# Patient Record
Sex: Male | Born: 2000 | Race: Black or African American | Hispanic: No | Marital: Single | Smoking: Never smoker
Health system: Southern US, Community
[De-identification: ages and names within clinical notes are randomized; demographics above are authoritative.]

## PROBLEM LIST (undated history)

## (undated) DIAGNOSIS — M6282 Rhabdomyolysis: Secondary | ICD-10-CM

## (undated) DIAGNOSIS — Z789 Other specified health status: Secondary | ICD-10-CM

## (undated) HISTORY — DX: Rhabdomyolysis: M62.82

## (undated) HISTORY — PX: NO PAST SURGERIES: SHX2092

---
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
## 2019-01-28 ENCOUNTER — Ambulatory Visit (INDEPENDENT_AMBULATORY_CARE_PROVIDER_SITE_OTHER)
Admission: EM | Admit: 2019-01-28 | Discharge: 2019-01-28 | Disposition: A | Discharge status: Other Acute Inpatient Hospital | Payer: Medicaid Other | Source: Home / Self Care | Attending: Emergency Medicine | Admitting: Emergency Medicine

## 2019-01-28 ENCOUNTER — Other Ambulatory Visit: Payer: Self-pay

## 2019-01-28 ENCOUNTER — Encounter: Payer: Self-pay | Admitting: Emergency Medicine

## 2019-01-28 ENCOUNTER — Inpatient Hospital Stay
Admission: EM | Admit: 2019-01-28 | Discharge: 2019-02-05 | DRG: 558 | Disposition: A | Discharge status: Home / Self Care | Payer: Medicaid Other | Attending: Family Medicine | Admitting: Family Medicine

## 2019-01-28 DIAGNOSIS — Z791 Long term (current) use of non-steroidal anti-inflammatories (NSAID): Secondary | ICD-10-CM | POA: Diagnosis not present

## 2019-01-28 DIAGNOSIS — R740 Nonspecific elevation of levels of transaminase and lactic acid dehydrogenase [LDH]: Secondary | ICD-10-CM | POA: Diagnosis present

## 2019-01-28 DIAGNOSIS — E876 Hypokalemia: Secondary | ICD-10-CM | POA: Diagnosis present

## 2019-01-28 DIAGNOSIS — M6282 Rhabdomyolysis: Principal | ICD-10-CM | POA: Diagnosis present

## 2019-01-28 DIAGNOSIS — K759 Inflammatory liver disease, unspecified: Secondary | ICD-10-CM

## 2019-01-28 DIAGNOSIS — B179 Acute viral hepatitis, unspecified: Secondary | ICD-10-CM | POA: Diagnosis not present

## 2019-01-28 HISTORY — DX: Other specified health status: Z78.9

## 2019-01-28 LAB — URINALYSIS, COMPLETE (UACMP) WITH MICROSCOPIC
BILIRUBIN URINE: NEGATIVE
Bacteria, UA: NONE SEEN
GLUCOSE, UA: NEGATIVE mg/dL
KETONES UR: NEGATIVE mg/dL
LEUKOCYTES UA: NEGATIVE
Nitrite: NEGATIVE
Specific Gravity, Urine: 1.025 (ref 1.005–1.030)
WBC UA: NONE SEEN WBC/hpf (ref 0–5)
pH: 6 (ref 5.0–8.0)

## 2019-01-28 LAB — BASIC METABOLIC PANEL
ANION GAP: 10 (ref 5–15)
BUN: 10 mg/dL (ref 6–20)
CHLORIDE: 105 mmol/L (ref 98–111)
CO2: 26 mmol/L (ref 22–32)
Calcium: 9.9 mg/dL (ref 8.9–10.3)
Creatinine, Ser: 1.09 mg/dL (ref 0.61–1.24)
GFR calc Af Amer: >60 mL/min (ref >60)
GFR calc non Af Amer: >60 mL/min (ref >60)
Glucose, Bld: 95 mg/dL (ref 70–99)
POTASSIUM: 3.5 mmol/L (ref 3.5–5.1)
SODIUM: 141 mmol/L (ref 135–145)

## 2019-01-28 LAB — CK: Total CK: 34473 U/L — ABNORMAL HIGH (ref 49–397)

## 2019-01-28 MED ORDER — SODIUM CHLORIDE 0.9 % IV BOLUS
2000.0000 mL | Freq: Once | INTRAVENOUS | Status: AC
  Administered 2019-01-28: 2000 mL via INTRAVENOUS

## 2019-01-28 NOTE — ED Triage Notes (Signed)
Pt here for bilateral arm pain for 2 days. Is in weightlifting class and said right now the pain is worse in his right arm. No injuries reported. Does report swelling in his right arm. Redness in his right lower arm but does see obvious protrusions in his right muscle.

## 2019-01-28 NOTE — ED Provider Notes (Signed)
HPI  SUBJECTIVE:  Jason Glass is a 18 y.o. male who presents with constant stabbing bilateral arm pain, swelling, inability to fully extend his arms yesterday.  Reports some pectoral muscle pain.  States that he has been weightlifting daily for the past 2-1/2 weeks with the advanced lifting class/football players.  Said he is rotating muscle groups, but that he had a heavy arm workout yesterday.  He denies pushing himself or lifting heavier weight than usual.  Mother states that he has not lifted weights since last spring. States that the arm swelling is located along the lateral ACs bilaterally.  He tried Tylenol, ice, hot bath.  The hot baths seem to help.  Symptoms are worse when he attempts to extend his arm.  He denies numbness or tingling his hand, color changes, color in the change of his urine.  No nausea, vomiting, fevers, lower extremity edema, leg pain.  Last dose of Tylenol was within 4 to 6 hours of evaluation.  Past medical history negative for kidney disease, rhabdomyolysis.  All immunizations are up-to-date.  Patient is adopted.  PMD: They are planning to establish care at [facility] primary care.    History reviewed. No pertinent past medical history.  History reviewed. No pertinent surgical history.  Family History  Problem Relation Age of Onset  . Healthy Mother   . Healthy Father     Social History   Tobacco Use  . Smoking status: Never Smoker  . Smokeless tobacco: Never Used  Substance Use Topics  . Alcohol use: No  . Drug use: Not on file    No current facility-administered medications for this encounter.   Current Outpatient Medications:  .  ibuprofen (ADVIL,MOTRIN) 400 MG tablet, Take 1 tablet (400 mg total) by mouth 3 (three) times daily., Disp: 30 tablet, Rfl: 1  No Known Allergies   ROS  As noted in HPI.   Physical Exam  BP (!) 143/70 (BP Location: Left Arm)   Pulse 63   Temp 98.6 F (37 C) (Oral)   Resp 18   Ht 5\' 8"  (1.727 m)   Wt 73.9 kg   SpO2  100%   BMI 24.78 kg/m   Constitutional: Well developed, well nourished, no acute distress Eyes:  EOMI, conjunctiva normal bilaterally HENT: Normocephalic, atraumatic,mucus membranes moist Respiratory: Normal inspiratory effort Cardiovascular: Normal rate GI: nondistended skin: No rash, skin intact Musculoskeletal: Arms symmetric.  Positive exquisite tenderness over bilateral biceps.  Some swelling over the lateral ACs bilaterally.  No tenderness of the deltoid, triceps.  RP 2+ bilaterally.  No tenderness over the forearm.  Patient unable to completely straighten his arms bilaterally.  No color changes.  Positive mild tenderness over bilateral pectoral muscles. Neurologic: Alert & oriented x 3, no focal neuro deficits Psychiatric: Speech and behavior appropriate   ED Course   Medications - No data to display  Orders Placed This Encounter  Procedures  . Basic metabolic panel    Standing Status:   Standing    Number of Occurrences:   1  . Urinalysis, Complete w Microscopic    Standing Status:   Standing    Number of Occurrences:   1  . CK    Standing Status:   Standing    Number of Occurrences:   1    Results for orders placed or performed during the hospital encounter of 01/28/19 (from the past 24 hour(s))  Basic metabolic panel     Status: None   Collection Time: 01/28/19  8:08  PM  Result Value Ref Range   Sodium 141 135 - 145 mmol/L   Potassium 3.5 3.5 - 5.1 mmol/L   Chloride 105 98 - 111 mmol/L   CO2 26 22 - 32 mmol/L   Glucose, Bld 95 70 - 99 mg/dL   BUN 10 6 - 20 mg/dL   Creatinine, Ser 1.611.09 0.61 - 1.24 mg/dL   Calcium 9.9 8.9 - 09.610.3 mg/dL   GFR calc non Af Amer >60 >60 mL/min   GFR calc Af Amer >60 >60 mL/min   Anion gap 10 5 - 15  Urinalysis, Complete w Microscopic     Status: Abnormal   Collection Time: 01/28/19  8:08 PM  Result Value Ref Range   Color, Urine YELLOW YELLOW   APPearance HAZY (A) CLEAR   Specific Gravity, Urine 1.025 1.005 - 1.030   pH 6.0  5.0 - 8.0   Glucose, UA NEGATIVE NEGATIVE mg/dL   Hgb urine dipstick MODERATE (A) NEGATIVE   Bilirubin Urine NEGATIVE NEGATIVE   Ketones, ur NEGATIVE NEGATIVE mg/dL   Protein, ur TRACE (A) NEGATIVE mg/dL   Nitrite NEGATIVE NEGATIVE   Leukocytes, UA NEGATIVE NEGATIVE   Squamous Epithelial / LPF 0-5 0 - 5   WBC, UA NONE SEEN 0 - 5 WBC/hpf   RBC / HPF 6-10 0 - 5 RBC/hpf   Bacteria, UA NONE SEEN NONE SEEN   Mucus PRESENT   CK     Status: Abnormal   Collection Time: 01/28/19  8:08 PM  Result Value Ref Range   Total CK 34,473 (H) 49 - 397 U/L   No results found.  ED Clinical Impression  Non-traumatic rhabdomyolysis   ED Assessment/Plan  Concern for rhabdomyolysis.  Checking UA, BMP, CK.  Will reevaluate.  Has moderate hematuria but only few RBCs.  His kidney function is normal.  CK is being diluted down.  Final result pending.  Final CK 34,473.  S[facility] ED for rhabdomyolysis.  Feel that he is stable to go by private vehicle.   Discussed labs, MDM, treatment plan, and rationale for transfer with patient and parent.  They agree with plan.  Discussed with triage nurse.  No orders of the defined types were placed in this encounter.   *This clinic note was created using Dragon dictation software. Therefore, there may be occasional mistakes despite careful proofreading.   ?    Domenick GongMortenson, Quantasia Stegner, MD 01/28/19 2150

## 2019-01-28 NOTE — ED Triage Notes (Signed)
PT was seen at urgent care and diagnosed with rhabdomyolysis.

## 2019-01-29 ENCOUNTER — Other Ambulatory Visit: Payer: Self-pay

## 2019-01-29 ENCOUNTER — Encounter: Payer: Self-pay | Admitting: *Deleted

## 2019-01-29 LAB — CK
CK TOTAL: 48252 U/L — AB (ref 49–397)
Total CK: 35402 U/L — ABNORMAL HIGH (ref 49–397)
Total CK: 47166 U/L — ABNORMAL HIGH (ref 49–397)

## 2019-01-29 LAB — BASIC METABOLIC PANEL
Anion gap: 4 — ABNORMAL LOW (ref 5–15)
BUN: 9 mg/dL (ref 6–20)
CO2: 26 mmol/L (ref 22–32)
Calcium: 8.9 mg/dL (ref 8.9–10.3)
Chloride: 109 mmol/L (ref 98–111)
Creatinine, Ser: 1.04 mg/dL (ref 0.61–1.24)
GFR calc Af Amer: >60 mL/min (ref >60)
GFR calc non Af Amer: >60 mL/min (ref >60)
Glucose, Bld: 93 mg/dL (ref 70–99)
Potassium: 3.6 mmol/L (ref 3.5–5.1)
Sodium: 139 mmol/L (ref 135–145)

## 2019-01-29 MED ORDER — ZOLPIDEM TARTRATE 5 MG PO TABS: 5.0000 mg | ORAL_TABLET | Freq: Every evening | ORAL | Status: DC | PRN

## 2019-01-29 MED ORDER — SODIUM BICARBONATE 8.4 % IV SOLN
INTRAVENOUS | Status: DC
  Administered 2019-01-29 – 2019-01-30 (×8): via INTRAVENOUS
  Filled 2019-01-29 (×17): qty 150

## 2019-01-29 MED ORDER — INFLUENZA VAC SPLIT QUAD 0.5 ML IM SUSY
0.5000 mL | PREFILLED_SYRINGE | INTRAMUSCULAR | Status: DC
  Filled 2019-01-29: qty 0.5

## 2019-01-29 MED ORDER — ACETAMINOPHEN 325 MG PO TABS
650.0000 mg | ORAL_TABLET | Freq: Four times a day (QID) | ORAL | Status: DC | PRN
  Administered 2019-01-29 (×2): 650 mg via ORAL
  Filled 2019-01-29 (×2): qty 2

## 2019-01-29 MED ORDER — ONDANSETRON HCL 4 MG/2ML IJ SOLN: 4.0000 mg | Freq: Four times a day (QID) | INTRAMUSCULAR | Status: DC | PRN

## 2019-01-29 MED ORDER — SODIUM CHLORIDE 0.9 % IV SOLN
INTRAVENOUS | Status: DC
  Administered 2019-01-29: 02:00:00 via INTRAVENOUS

## 2019-01-29 MED ORDER — ZOLPIDEM TARTRATE 5 MG PO TABS
5.0000 mg | ORAL_TABLET | Freq: Once | ORAL | Status: AC
  Administered 2019-01-29: 5 mg via ORAL
  Filled 2019-01-29: qty 1

## 2019-01-29 MED ORDER — CYCLOBENZAPRINE HCL 10 MG PO TABS: 5.0000 mg | ORAL_TABLET | Freq: Three times a day (TID) | ORAL | Status: DC | PRN

## 2019-01-29 MED ORDER — ZOLPIDEM TARTRATE 5 MG PO TABS
5.0000 mg | ORAL_TABLET | Freq: Every evening | ORAL | Status: DC | PRN
  Administered 2019-01-29 – 2019-01-31 (×2): 5 mg via ORAL
  Filled 2019-01-29 (×2): qty 1

## 2019-01-29 NOTE — ED Notes (Signed)
Pt provided turkey sandwich tray per request. 

## 2019-01-29 NOTE — ED Notes (Signed)
Hospitalist to bedside at this time 

## 2019-01-29 NOTE — H&P (Addendum)
[facility] [place] [facility]   PATIENT NAME: Jason Glass    MR#:  [id number]  DATE OF BIRTH:  September 13, 2001  DATE OF ADMISSION:  01/28/2019  PRIMARY CARE PHYSICIAN: Patient, No Pcp Per   REQUESTING/REFERRING PHYSICIAN: Manson PasseyBrown  CHIEF COMPLAINT:   Chief Complaint  Patient presents with  . Abnormal Lab    HISTORY OF PRESENT ILLNESS: Jason Oldslijah Mascio  is a 18 y.o. male with a known history of no medical issues, recently enrolled in weight lifting team in school and started doing high intensity exercises.  For last few days he has been hurting excruciating in his muscles so came to emergency room.  Noted to have very high CK level and rhabdomyolysis.  Renal function is normal.  Patient denies any other complaints.  PAST MEDICAL HISTORY:   Past Medical History:  Diagnosis Date  . Medical history non-contributory     PAST SURGICAL HISTORY:  Past Surgical History:  Procedure Laterality Date  . NO PAST SURGERIES      SOCIAL HISTORY:  Social History   Tobacco Use  . Smoking status: Never Smoker  . Smokeless tobacco: Never Used  Substance Use Topics  . Alcohol use: No    FAMILY HISTORY:  Family History  Problem Relation Age of Onset  . Healthy Mother   . Healthy Father     DRUG ALLERGIES: No Known Allergies  REVIEW OF SYSTEMS:   CONSTITUTIONAL: No fever, fatigue or weakness.  EYES: No blurred or double vision.  EARS, NOSE, AND THROAT: No tinnitus or ear pain.  RESPIRATORY: No cough, shortness of breath, wheezing or hemoptysis.  CARDIOVASCULAR: No chest pain, orthopnea, edema.  GASTROINTESTINAL: No nausea, vomiting, diarrhea or abdominal pain.  GENITOURINARY: No dysuria, hematuria.  ENDOCRINE: No polyuria, nocturia,  HEMATOLOGY: No anemia, easy bruising or bleeding SKIN: No rash or lesion. MUSCULOSKELETAL: No joint pain or arthritis.   NEUROLOGIC: No tingling, numbness, weakness.  PSYCHIATRY: No anxiety or depression.   MEDICATIONS AT HOME:   Prior to Admission medications   Medication Sig Start Date End Date Taking? Authorizing Provider  ibuprofen (ADVIL,MOTRIN) 400 MG tablet Take 1 tablet (400 mg total) by mouth 3 (three) times daily. 03/06/17   Menshew, Charlesetta IvoryJenise V Bacon, PA-C      PHYSICAL EXAMINATION:   VITAL SIGNS: Blood pressure 126/70, pulse (!) 48, temperature 98.2 F (36.8 C), temperature source Oral, resp. rate 16, height 5\' 8"  (1.727 m), weight 73 kg, SpO2 99 %.  GENERAL:  18 y.o.-year-old patient lying in the bed with no acute distress.  EYES: Pupils equal, round, reactive to light and accommodation. No scleral icterus. Extraocular muscles intact.  HEENT: Head atraumatic, normocephalic. Oropharynx and nasopharynx clear.  NECK:  Supple, no jugular venous distention. No thyroid enlargement, no tenderness.  LUNGS: Normal breath sounds bilaterally, no wheezing, rales,rhonchi or crepitation. No use of accessory muscles of respiration.  CARDIOVASCULAR: S1, S2 normal. No murmurs, rubs, or gallops.  ABDOMEN: Soft, nontender, nondistended. Bowel sounds present. No organomegaly or mass.  EXTREMITIES: No pedal edema, cyanosis, or clubbing.  Muscles tender on local pressure. NEUROLOGIC: Cranial nerves II through XII are intact. Muscle strength 5/5 in all extremities. Sensation intact. Gait not checked.  PSYCHIATRIC: The patient is alert and oriented x 3.  SKIN: No obvious rash, lesion, or ulcer.   LABORATORY PANEL:   CBC No results for input(s): WBC, HGB, HCT, PLT, MCV, MCH, MCHC, RDW, LYMPHSABS, MONOABS, EOSABS, BASOSABS, BANDABS in the last 168 hours.  Invalid input(s): NEUTRABS,  BANDSABD ------------------------------------------------------------------------------------------------------------------  Chemistries  Recent Labs  Lab 01/28/19 2008  NA 141  K 3.5  CL 105  CO2 26  GLUCOSE 95  BUN 10  CREATININE 1.09  CALCIUM 9.9    ------------------------------------------------------------------------------------------------------------------ estimated creatinine clearance is 106.3 mL/min (by C-G formula based on SCr of 1.09 mg/dL). ------------------------------------------------------------------------------------------------------------------ No results for input(s): TSH, T4TOTAL, T3FREE, THYROIDAB in the last 72 hours.  Invalid input(s): FREET3   Coagulation profile No results for input(s): INR, PROTIME in the last 168 hours. ------------------------------------------------------------------------------------------------------------------- No results for input(s): DDIMER in the last 72 hours. -------------------------------------------------------------------------------------------------------------------  Cardiac Enzymes No results for input(s): CKMB, TROPONINI, MYOGLOBIN in the last 168 hours.  Invalid input(s): CK ------------------------------------------------------------------------------------------------------------------ Invalid input(s): POCBNP  ---------------------------------------------------------------------------------------------------------------  Urinalysis    Component Value Date/Time   COLORURINE YELLOW 01/28/2019 2008   APPEARANCEUR HAZY (A) 01/28/2019 2008   LABSPEC 1.025 01/28/2019 2008   PHURINE 6.0 01/28/2019 2008   GLUCOSEU NEGATIVE 01/28/2019 2008   HGBUR MODERATE (A) 01/28/2019 2008   BILIRUBINUR NEGATIVE 01/28/2019 2008   KETONESUR NEGATIVE 01/28/2019 2008   PROTEINUR TRACE (A) 01/28/2019 2008   NITRITE NEGATIVE 01/28/2019 2008   LEUKOCYTESUR NEGATIVE 01/28/2019 2008     RADIOLOGY: No results found.  EKG: No orders found for this or any previous visit.  IMPRESSION AND PLAN:  *Rhabdomyolysis IV fluid. Encourage to drink 3 to 4 L of water every day for next few days. Follow CK level and renal function daily. Advised to start slow with basic exercise and  slowly build up over few days on intensity.    All the records are reviewed and case discussed with ED provider. Management plans discussed with the patient, family and they are in agreement.  CODE STATUS: Full code  Patient's adoption mother was present in the room during my visit.  TOTAL TIME TAKING CARE OF THIS PATIENT: 40 minutes.    Altamese Dilling M.D on 01/29/2019   Between 7am to 6pm - Pager - [phone]  After 6pm go to [email or web address] - password [email or web address]  [facility] [place] [facility]  Office  [phone]  CC: Primary care physician; Patient, No Pcp Per   Note: This dictation was prepared with Dragon dictation along with smaller phrase technology. Any transcriptional errors that result from this process are unintentional.

## 2019-01-29 NOTE — Progress Notes (Signed)
[facility] - [place] [facility]   PATIENT NAME: Jason Glass    MR#:  [id number]  DATE OF BIRTH:  04-25-2001  SUBJECTIVE:  CHIEF COMPLAINT:   Chief Complaint  Patient presents with  . Abnormal Lab  Patient seen and evaluated today Has some body aches Soreness in the calf muscles  REVIEW OF SYSTEMS:    ROS  CONSTITUTIONAL: No documented fever. Has fatigue, weakness. No weight gain, no weight loss.  EYES: No blurry or double vision.  ENT: No tinnitus. No postnasal drip. No redness of the oropharynx.  RESPIRATORY: No cough, no wheeze, no hemoptysis. No dyspnea.  CARDIOVASCULAR: No chest pain. No orthopnea. No palpitations. No syncope.  GASTROINTESTINAL: No nausea, no vomiting or diarrhea. No abdominal pain. No melena or hematochezia.  GENITOURINARY: No dysuria or hematuria.  ENDOCRINE: No polyuria or nocturia. No heat or cold intolerance.  HEMATOLOGY: No anemia. No bruising. No bleeding.  INTEGUMENTARY: No rashes. No lesions.  MUSCULOSKELETAL: No arthritis. No swelling. No gout.  Muscle aches noted NEUROLOGIC: No numbness, tingling, or ataxia. No seizure-type activity.  PSYCHIATRIC: No anxiety. No insomnia. No ADD.   DRUG ALLERGIES:  No Known Allergies  VITALS:  Blood pressure 123/80, pulse (!) 44, temperature 98.3 F (36.8 C), temperature source Oral, resp. rate 14, height 5\' 8"  (1.727 m), weight 73 kg, SpO2 99 %.  PHYSICAL EXAMINATION:   Physical Exam  GENERAL:  18 y.o.-year-old patient lying in the bed with no acute distress.  EYES: Pupils equal, round, reactive to light and accommodation. No scleral icterus. Extraocular muscles intact.  HEENT: Head atraumatic, normocephalic. Oropharynx and nasopharynx clear.  NECK:  Supple, no jugular venous distention. No thyroid enlargement, no tenderness.  LUNGS: Normal breath sounds bilaterally, no wheezing, rales, rhonchi. No use of accessory muscles of respiration.  CARDIOVASCULAR: S1, S2 normal. No  murmurs, rubs, or gallops.  ABDOMEN: Soft, nontender, nondistended. Bowel sounds present. No organomegaly or mass.  EXTREMITIES: No cyanosis, clubbing or edema b/l.    NEUROLOGIC: Cranial nerves II through XII are intact. No focal Motor or sensory deficits b/l.   PSYCHIATRIC: The patient is alert and oriented x 3.  SKIN: No obvious rash, lesion, or ulcer.   LABORATORY PANEL:   CBC No results for input(s): WBC, HGB, HCT, PLT in the last 168 hours. ------------------------------------------------------------------------------------------------------------------ Chemistries  Recent Labs  Lab 01/29/19 0331  NA 139  K 3.6  CL 109  CO2 26  GLUCOSE 93  BUN 9  CREATININE 1.04  CALCIUM 8.9   ------------------------------------------------------------------------------------------------------------------  Cardiac Enzymes No results for input(s): TROPONINI in the last 168 hours. ------------------------------------------------------------------------------------------------------------------  RADIOLOGY:  No results found.   ASSESSMENT AND PLAN:   18 year old male patient with no significant past medical history currently under hospitalist service  -Acute rhabdomyolysis Change IV fluids to IV bicarbonate with dextrose at 200 mL/h Follow-up CK level every 4 hourly Monitor renal function  -Generalized body aches secondary to rhabdomyolysis Symptomatic management  -DVT prophylaxis sequential compression device to lower extremities  All the records are reviewed and case discussed with Care Management/Social Worker. Management plans discussed with the patient, family and they are in agreement.  CODE STATUS:  Full code  DVT Prophylaxis: SCDs  TOTAL TIME TAKING CARE OF THIS PATIENT: 36 minutes.   POSSIBLE D/C IN 2 to 3 DAYS, DEPENDING ON CLINICAL CONDITION.  Ihor AustinPavan Pyreddy M.D on 01/29/2019 at 11:25 AM  Between 7am to 6pm - Pager - [phone]  After 6pm go to  [email or web address] - password  [email or web address]  [facility] [place] [facility]  Office  [phone]  CC: Primary care physician; Patient, No Pcp Per  Note: This dictation was prepared with Dragon dictation along with smaller phrase technology. Any transcriptional errors that result from this process are unintentional.

## 2019-01-29 NOTE — Progress Notes (Signed)
Nutrition Brief Note  Patient identified on the Malnutrition Screening Tool (MST) Report  Wt Readings from Last 15 Encounters:  01/29/19 73 kg (68 %, Z= 0.47)*  01/28/19 73.9 kg (71 %, Z= 0.54)*  03/06/17 63.5 kg (56 %, Z= 0.16)*   * Growth percentiles are based on CDC (Boys, 2-20 Years) data.   Lela Gochenaur  is a 18 y.o. male with a known history of no medical issues, recently enrolled in weight lifting team in school and started doing high intensity exercises.  For last few days he has been hurting excruciating in his muscles so came to emergency room.  Noted to have very high CK level and rhabdomyolysis.  Renal function is normal.  Patient denies any other complaints.  Pt admitted with rhabdomyolosis.   Reviewed I/O's: +2.5 L x 24 hours  Spoke with pt and school coach at bedside. Pt was very pleasant and reports feeling well today. 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 He typically has a good appetite; he skips breakfast, but snacks during class and consumes lunch and dinner (Snack: cheese, crackers, bottle of water; Lunch: school lunch; Dinner: meat, starch, and vegetable). Pt reports he did not eat dinner two days this week due to lack of appetite.   Pt reports his UBW is 163# and estimates he has lost 3# within the past week. Observed meal tray- pt consumed 100% of eggs, pancakes, milk, and fruit cup.   Nutrition-Focused physical exam completed. Findings are no fat depletion, no muscle depletion, and no edema.   Discussed importance of good meal completion to promote healing. Pt with no further questions or concerns, but expressed appreciation for visit.   Labs reviewed.  Body mass index is 24.46 kg/m. Patient meets criteria for normal weight range based on current BMI.   Current diet order is regular, patient is consuming approximately 75% of meals at this time. Labs and  medications reviewed.   No nutrition interventions warranted at this time. If nutrition issues arise, please consult RD.   Shelvie Salsberry A. Mayford Knife, RD, LDN, CDE Pager: [phone] After hours Pager: [phone]

## 2019-01-29 NOTE — ED Provider Notes (Signed)
[facility] Emergency Department Provider Note   First MD Initiated Contact with Patient 01/28/19 2349     (approximate)  I have reviewed the triage vital signs and the nursing notes.   HISTORY  Chief Complaint Abnormal Lab    HPI Jason Glass is a 18 y.o. male presents to the emergency department referred from urgent care secondary to concern for rhabdomyolysis.  Patient states that he has had sharp/stabbing generalized muscle aches including arms legs and chest.  Patient states that he started a intense weightlifting class I week ago Wednesday.  Patient states last very intense workout was done yesterday at which point he was pushing himself and therefore lifting heavier than usual weights.  Patient states that he has taken Tylenol and warm baths at home with some relief.  Patient CK noted to be 34,473 with a creatinine of 1.09.  Patient denies any decrease or dark urine   History reviewed. No pertinent past medical history.  Patient Active Problem List   Diagnosis Date Noted  . Rhabdomyolysis 01/28/2019    History reviewed. No pertinent surgical history.  Prior to Admission medications   Medication Sig Start Date End Date Taking? Authorizing Provider  ibuprofen (ADVIL,MOTRIN) 400 MG tablet Take 1 tablet (400 mg total) by mouth 3 (three) times daily. 03/06/17   Menshew, Charlesetta Ivory, PA-C    Allergies Patient has no known allergies.  Family History  Problem Relation Age of Onset  . Healthy Mother   . Healthy Father     Social History Social History   Tobacco Use  . Smoking status: Never Smoker  . Smokeless tobacco: Never Used  Substance Use Topics  . Alcohol use: No  . Drug use: Not on file    Review of Systems Constitutional: No fever/chills Eyes: No visual changes. ENT: No sore throat. Cardiovascular: Denies chest pain. Respiratory: Denies shortness of breath. Gastrointestinal: No abdominal pain.  No nausea, no vomiting.  No  diarrhea.  No constipation. Genitourinary: Negative for dysuria. Musculoskeletal: Positive for generalized muscle pain integumentary: Negative for rash. Neurological: Negative for headaches, focal weakness or numbness.   ____________________________________________   PHYSICAL EXAM:  VITAL SIGNS: ED Triage Vitals  Enc Vitals Group     BP 01/28/19 2215 137/81     Pulse Rate 01/28/19 2215 (!) 53     Resp 01/28/19 2215 19     Temp 01/28/19 2215 98.4 F (36.9 C)     Temp Source 01/28/19 2215 Oral     SpO2 01/28/19 2215 98 %     Weight 01/28/19 2210 73.9 kg (163 lb)     Height 01/28/19 2210 1.727 m (5\' 8" )     Head Circumference --      Peak Flow --      Pain Score 01/28/19 2210 10     Pain Loc --      Pain Edu? --      Excl. in GC? --     Constitutional: Alert and oriented. Well appearing and in no acute distress. Eyes: Conjunctivae are normal.  Mouth/Throat: Mucous membranes are moist. Oropharynx non-erythematous. Neck: No stridor.   Cardiovascular: Normal rate, regular rhythm. Good peripheral circulation. Grossly normal heart sounds. Respiratory: Normal respiratory effort.  No retractions. Lungs CTAB. Gastrointestinal: Soft and nontender. No distention.  Musculoskeletal: No lower extremity tenderness nor edema. No gross deformities of extremities. Neurologic:  Normal speech and language. No gross focal neurologic deficits are appreciated.  Skin:  Skin is warm, dry and  intact. No rash noted. Psychiatric: Mood and affect are normal. Speech and behavior are normal.     Procedures   ____________________________________________   INITIAL IMPRESSION / ASSESSMENT AND PLAN / ED COURSE  As part of my medical decision making, I reviewed the following data within the electronic MEDICAL RECORD NUMBER  18 year old male presenting with above-stated history and physical exam consistent with acute rhabdomyolysis.  Patient receiving 2 L IV normal saline at present.  Patient states that  pain has improved on reevaluation.  Spoke with the patient and his mother at length regarding rhabdomyolysis and necessity of admission to which they are agreeable.  Patient discussed with Dr. Elisabeth PigeonVachhani for hospital admission for further evaluation and management ____________________________________________  FINAL CLINICAL IMPRESSION(S) / ED DIAGNOSES  Final diagnoses:  Non-traumatic rhabdomyolysis     MEDICATIONS GIVEN DURING THIS VISIT:  Medications  sodium chloride 0.9 % bolus 2,000 mL (0 mLs Intravenous Stopped 01/28/19 2333)     ED Discharge Orders    None       Note:  This document was prepared using Dragon voice recognition software and may include unintentional dictation errors.    Darci CurrentBrown, [place] N, MD 01/29/19 [phone]

## 2019-01-30 LAB — CBC
HCT: 37.6 % — ABNORMAL LOW (ref 39.0–52.0)
Hemoglobin: 12.5 g/dL — ABNORMAL LOW (ref 13.0–17.0)
MCH: 29.8 pg (ref 26.0–34.0)
MCHC: 33.2 g/dL (ref 30.0–36.0)
MCV: 89.5 fL (ref 80.0–100.0)
Platelets: 146 10*3/uL — ABNORMAL LOW (ref 150–400)
RBC: 4.2 MIL/uL — ABNORMAL LOW (ref 4.22–5.81)
RDW: 12.4 % (ref 11.5–15.5)
WBC: 4.4 10*3/uL (ref 4.0–10.5)
nRBC: 0 % (ref 0.0–0.2)

## 2019-01-30 LAB — URINE DRUG SCREEN, QUALITATIVE (ARMC ONLY)
Amphetamines, Ur Screen: NOT DETECTED
Barbiturates, Ur Screen: NOT DETECTED
Benzodiazepine, Ur Scrn: NOT DETECTED
COCAINE METABOLITE, UR ~~LOC~~: NOT DETECTED
Cannabinoid 50 Ng, Ur ~~LOC~~: POSITIVE — AB
MDMA (Ecstasy)Ur Screen: NOT DETECTED
Methadone Scn, Ur: NOT DETECTED
Opiate, Ur Screen: NOT DETECTED
PHENCYCLIDINE (PCP) UR S: NOT DETECTED
Tricyclic, Ur Screen: NOT DETECTED

## 2019-01-30 LAB — PROTIME-INR
INR: 0.96
Prothrombin Time: 12.7 seconds (ref 11.4–15.2)

## 2019-01-30 LAB — BASIC METABOLIC PANEL
Anion gap: 5 (ref 5–15)
BUN: 7 mg/dL (ref 6–20)
CO2: 35 mmol/L — ABNORMAL HIGH (ref 22–32)
Calcium: 9.3 mg/dL (ref 8.9–10.3)
Chloride: 99 mmol/L (ref 98–111)
Creatinine, Ser: 1.02 mg/dL (ref 0.61–1.24)
GFR calc Af Amer: >60 mL/min (ref >60)
GFR calc non Af Amer: >60 mL/min (ref >60)
Glucose, Bld: 113 mg/dL — ABNORMAL HIGH (ref 70–99)
POTASSIUM: 3.3 mmol/L — AB (ref 3.5–5.1)
Sodium: 139 mmol/L (ref 135–145)

## 2019-01-30 LAB — URINALYSIS, COMPLETE (UACMP) WITH MICROSCOPIC
Bilirubin Urine: NEGATIVE
Glucose, UA: NEGATIVE mg/dL
Hgb urine dipstick: NEGATIVE
Ketones, ur: NEGATIVE mg/dL
Leukocytes, UA: NEGATIVE
Nitrite: NEGATIVE
Protein, ur: NEGATIVE mg/dL
SPECIFIC GRAVITY, URINE: 1.004 — AB (ref 1.005–1.030)
Squamous Epithelial / HPF: NONE SEEN (ref 0–5)
WBC, UA: NONE SEEN WBC/hpf (ref 0–5)
pH: 9 — ABNORMAL HIGH (ref 5.0–8.0)

## 2019-01-30 LAB — CK
Total CK: 40972 U/L — ABNORMAL HIGH (ref 49–397)
Total CK: 42970 U/L — ABNORMAL HIGH (ref 49–397)
Total CK: 45951 U/L — ABNORMAL HIGH (ref 49–397)
Total CK: 47143 U/L — ABNORMAL HIGH (ref 49–397)

## 2019-01-30 LAB — PHOSPHORUS: Phosphorus: 4.1 mg/dL (ref 2.5–4.6)

## 2019-01-30 LAB — MAGNESIUM: Magnesium: 2.1 mg/dL (ref 1.7–2.4)

## 2019-01-30 MED ORDER — POTASSIUM CHLORIDE 20 MEQ PO PACK
40.0000 meq | PACK | Freq: Once | ORAL | Status: AC
  Administered 2019-01-30: 40 meq via ORAL
  Filled 2019-01-30: qty 2

## 2019-01-30 NOTE — Progress Notes (Addendum)
[employer] [facility] [facility] [place] [facility]   PATIENT NAME: Jason Glass    MR#:  [id number]  DATE OF BIRTH:  30-Dec-2001  SUBJECTIVE:  Patient without complaint, mother at the bedside, case discussed with nursing staff REVIEW OF SYSTEMS:    ROS  CONSTITUTIONAL: No documented fever. Has fatigue, weakness. No weight gain, no weight loss.  EYES: No blurry or double vision.  ENT: No tinnitus. No postnasal drip. No redness of the oropharynx.  RESPIRATORY: No cough, no wheeze, no hemoptysis. No dyspnea.  CARDIOVASCULAR: No chest pain. No orthopnea. No palpitations. No syncope.  GASTROINTESTINAL: No nausea, no vomiting or diarrhea. No abdominal pain. No melena or hematochezia.  GENITOURINARY: No dysuria or hematuria.  ENDOCRINE: No polyuria or nocturia. No heat or cold intolerance.  HEMATOLOGY: No anemia. No bruising. No bleeding.  INTEGUMENTARY: No rashes. No lesions.  MUSCULOSKELETAL: No arthritis. No swelling. No gout.  Muscle aches noted NEUROLOGIC: No numbness, tingling, or ataxia. No seizure-type activity.  PSYCHIATRIC: No anxiety. No insomnia. No ADD.   DRUG ALLERGIES:  No Known Allergies  VITALS:  Blood pressure 127/81, pulse (!) 46, temperature 98 F (36.7 C), temperature source Oral, resp. rate 16, height 5\' 8"  (1.727 m), weight 73 kg, SpO2 100 %.  PHYSICAL EXAMINATION:   Physical Exam  GENERAL:  18 y.o.-year-old patient lying in the bed with no acute distress.  EYES: Pupils equal, round, reactive to light and accommodation. No scleral icterus. Extraocular muscles intact.  HEENT: Head atraumatic, normocephalic. Oropharynx and nasopharynx clear.  NECK:  Supple, no jugular venous distention. No thyroid enlargement, no tenderness.  LUNGS: Normal breath sounds bilaterally, no wheezing, rales, rhonchi. No use of accessory muscles of respiration.  CARDIOVASCULAR: S1, S2 normal. No murmurs, rubs, or gallops.  ABDOMEN: Soft, nontender, nondistended. Bowel sounds  present. No organomegaly or mass.  EXTREMITIES: No cyanosis, clubbing or edema b/l.    NEUROLOGIC: Cranial nerves II through XII are intact. No focal Motor or sensory deficits b/l.   PSYCHIATRIC: The patient is alert and oriented x 3.  SKIN: No obvious rash, lesion, or ulcer.   LABORATORY PANEL:   CBC Recent Labs  Lab 01/30/19 0403  WBC 4.4  HGB 12.5*  HCT 37.6*  PLT 146*   ------------------------------------------------------------------------------------------------------------------ Chemistries  Recent Labs  Lab 01/30/19 0403  NA 139  K 3.3*  CL 99  CO2 35*  GLUCOSE 113*  BUN 7  CREATININE 1.02  CALCIUM 9.3   ------------------------------------------------------------------------------------------------------------------  Cardiac Enzymes No results for input(s): TROPONINI in the last 168 hours. ------------------------------------------------------------------------------------------------------------------  RADIOLOGY:  No results found.   ASSESSMENT AND PLAN:  18 year old male patient with no significant past medical history currently under hospitalist service  *Acute rhabdomyolysis Stable Secondary to aggressive weight training  Continue IV fluids for rehydration, CPK/BMP daily, avoid nephrotoxic agents  *Acute generalized body aches  secondary to rhabdomyolysis Stable on current regiment  *Acute hypokalemia Replete with p.o. potassium  DVT prophylaxis sequential compression device to lower extremities  All the records are reviewed and case discussed with Care Management/Social Worker. Management plans discussed with the patient, family and they are in agreement.  CODE STATUS:  Full code  DVT Prophylaxis: SCDs  TOTAL TIME TAKING CARE OF THIS PATIENT: 36 minutes.   POSSIBLE D/C IN 1 to 3 DAYS, DEPENDING ON CLINICAL CONDITION.  Jason Glass M.D on 01/30/2019 at 10:42 AM  Between 7am to 6pm - Pager - [phone]  After 6pm go to  [email or web address] - password [email or web address]  [facility]  Office  [phone]  CC: Primary care physician; Patient, No Pcp Per  Note: This dictation was prepared with Dragon dictation along with smaller phrase technology. Any transcriptional errors that result from this process are unintentional.

## 2019-01-30 NOTE — Progress Notes (Addendum)
Patient ID: Jason Glass, male   DOB: 2001-03-13, 18 y.o.   MRN: [id number] Rec'd page from nurse indicating that mom Jason Glass was concerned about CK levels not coming down.  I spoke with her for about 25 minutes answering her questions and discussing the natural course of rhabdo including an expected peak of CK levels at 24-72 hours after injury, and reassuring that kidney function, electrolytes are stable.   Patient currently on sodium bicarbonate in D5.  Ordered mag, phos, UA, UDS, PT/INR, ABG   Mom Jason Glass would like to be kept updated.  [phone]    Addendum:   Based on lab results below including pH, serum bicarb will discontinue bicarb drip and resume normal saline.  UDS pos for THC only.   Recent Results (from the past 2160 hour(s))  Basic metabolic panel     Status: None   Collection Time: 01/28/19  8:08 PM  Result Value Ref Range   Sodium 141 135 - 145 mmol/L   Potassium 3.5 3.5 - 5.1 mmol/L   Chloride 105 98 - 111 mmol/L   CO2 26 22 - 32 mmol/L   Glucose, Bld 95 70 - 99 mg/dL   BUN 10 6 - 20 mg/dL   Creatinine, Ser 8.651.09 0.61 - 1.24 mg/dL   Calcium 9.9 8.9 - 78.410.3 mg/dL   GFR calc non Af Amer >60 >60 mL/min   GFR calc Af Amer >60 >60 mL/min   Anion gap 10 5 - 15    Comment: Performed at [facility], [address] [place], [place] [address]  Urinalysis, Complete w Microscopic     Status: Abnormal   Collection Time: 01/28/19  8:08 PM  Result Value Ref Range   Color, Urine YELLOW YELLOW   APPearance HAZY (A) CLEAR   Specific Gravity, Urine 1.025 1.005 - 1.030   pH 6.0 5.0 - 8.0   Glucose, UA NEGATIVE NEGATIVE mg/dL   Hgb urine dipstick MODERATE (A) NEGATIVE   Bilirubin Urine NEGATIVE NEGATIVE   Ketones, ur NEGATIVE NEGATIVE mg/dL   Protein, ur TRACE (A) NEGATIVE mg/dL   Nitrite NEGATIVE NEGATIVE   Leukocytes, UA NEGATIVE NEGATIVE   Squamous Epithelial / LPF 0-5 0 - 5   WBC, UA NONE SEEN 0 - 5 WBC/hpf   RBC / HPF 6-10 0 - 5 RBC/hpf   Bacteria, UA NONE  SEEN NONE SEEN   Mucus PRESENT     Comment: Performed at [facility], [address] [place], [place] [address]  [place]     Status: Abnormal   Collection Time: 01/28/19  8:08 PM  Result Value Ref Range   Total CK 34,473 (H) 49 - 397 U/L    Comment: Performed at [facility], [address] [place], [place] [address]  CK     Status: Abnormal   Collection Time: 01/29/19  3:31 AM  Result Value Ref Range   Total CK 35,402 (H) 49 - 397 U/L    Comment: RESULT CONFIRMED BY MANUAL DILUTION TTG Performed at [facility], [address] [place], [place] [address]   Basic metabolic panel     Status: Abnormal   Collection Time: 01/29/19  3:31 AM  Result Value Ref Range   Sodium 139 135 - 145 mmol/L   Potassium 3.6 3.5 - 5.1 mmol/L   Chloride 109 98 - 111 mmol/L   CO2 26 22 - 32 mmol/L   Glucose, Bld 93 70 - 99 mg/dL   BUN 9  6 - 20 mg/dL   Creatinine, Ser 3.15 0.61 - 1.24 mg/dL   Calcium 8.9 8.9 - 17.6 mg/dL   GFR calc non Af Amer >60 >60 mL/min   GFR calc Af Amer >60 >60 mL/min   Anion gap 4 (L) 5 - 15    Comment: Performed at [facility], [address] [place], [place] [address]  CK     Status: Abnormal   Collection Time: 01/29/19 11:50 AM  Result Value Ref Range   Total CK 40,972 (H) 49 - 397 U/L    Comment: RESULTS CONFIRMED BY MANUAL DILUTION Performed at [facility], [address] [place], [place] [address] CORRECTED ON 02/01 AT 1409: [place] [id number] RESULT CONFIRMED BY MANUAL DILUTION SMA/DAS   CK     Status: Abnormal   Collection Time: 01/29/19  3:42 PM  Result Value Ref Range   Total CK 48,252 (H) 49 - 397 U/L    Comment: RESULT CONFIRMED BY MANUAL DILUTION...[facility] Performed at [facility], [address] [place], [place] [address]   CK     Status: Abnormal   Collection Time: 01/29/19  7:57 PM  Result Value Ref Range   Total CK 47,166 (H) 49 - 397 U/L    Comment: RESULT CONFIRMED BY  MANUAL DILUTION Performed at [facility], [address] [place], [place] [address]   CK     Status: Abnormal   Collection Time: 01/29/19 11:49 PM  Result Value Ref Range   Total CK 45,951 (H) 49 - 397 U/L    Comment: RESULT CONFIRMED BY MANUAL DILUTION Performed at [facility], [address] [place], [place] [address]   CK     Status: Abnormal   Collection Time: 01/30/19  4:03 AM  Result Value Ref Range   Total CK 42,970 (H) 49 - 397 U/L    Comment: RESULT CONFIRMED BY MANUAL DILUTION Performed at [facility], [address] [place], [place] [address]   Basic metabolic panel     Status: Abnormal   Collection Time: 01/30/19  4:03 AM  Result Value Ref Range   Sodium 139 135 - 145 mmol/L   Potassium 3.3 (L) 3.5 - 5.1 mmol/L   Chloride 99 98 - 111 mmol/L   CO2 35 (H) 22 - 32 mmol/L   Glucose, Bld 113 (H) 70 - 99 mg/dL   BUN 7 6 - 20 mg/dL   Creatinine, Ser 9.93 0.61 - 1.24 mg/dL   Calcium 9.3 8.9 - 71.6 mg/dL   GFR calc non Af Amer >60 >60 mL/min   GFR calc Af Amer >60 >60 mL/min   Anion gap 5 5 - 15    Comment: Performed at [facility], [address] [place], [place] [address]  CBC     Status: Abnormal   Collection Time: 01/30/19  4:03 AM  Result Value Ref Range   WBC 4.4 4.0 - 10.5 K/uL   RBC 4.20 (L) 4.22 - 5.81 MIL/uL   Hemoglobin 12.5 (L) 13.0 - 17.0 g/dL   HCT 38.1 (L) 01.7 - 51.0 %   MCV 89.5 80.0 - 100.0 fL   MCH 29.8 26.0 - 34.0 pg   MCHC 33.2 30.0 - 36.0 g/dL   RDW 25.8 52.7 - 78.2 %   Platelets 146 (L) 150 - 400 K/uL   nRBC 0.0 0.0 - 0.2 %    Comment: Performed at [facility], [address] [place], [place] [address]  CK     Status: Abnormal   Collection Time: 01/30/19  8:11 AM  Result Value Ref Range   Total CK >50,000 (H) 49 - 397 U/L    Comment: RESULT CONFIRMED BY MANUAL DILUTION  JJB Performed at [facility], [address] [place], [place] [address]   CK     Status: Abnormal    Collection Time: 01/30/19  6:03 PM  Result Value Ref Range   Total CK 47,143 (H) 49 - 397 U/L    Comment: RESULT CONFIRMED BY MANUAL DILUTION Performed at [facility], [address] [place], [place] [address]   Urinalysis, Complete w Microscopic     Status: Abnormal   Collection Time: 01/30/19 10:15 PM  Result Value Ref Range   Color, Urine COLORLESS (A) YELLOW   APPearance CLEAR (A) CLEAR   Specific Gravity, Urine 1.004 (L) 1.005 - 1.030   pH 9.0 (H) 5.0 - 8.0   Glucose, UA NEGATIVE NEGATIVE mg/dL   Hgb urine dipstick NEGATIVE NEGATIVE   Bilirubin Urine NEGATIVE NEGATIVE   Ketones, ur NEGATIVE NEGATIVE mg/dL   Protein, ur NEGATIVE NEGATIVE mg/dL   Nitrite NEGATIVE NEGATIVE   Leukocytes, UA NEGATIVE NEGATIVE   RBC / HPF 0-5 0 - 5 RBC/hpf   WBC, UA NONE SEEN 0 - 5 WBC/hpf   Bacteria, UA RARE (A) NONE SEEN   Squamous Epithelial / LPF NONE SEEN 0 - 5    Comment: Performed at [facility], [address], [place], [place] [address]  Urine Drug Screen, Qualitative ([facility] only)     Status: Abnormal   Collection Time: 01/30/19 10:15 PM  Result Value Ref Range   Tricyclic, Ur Screen NONE DETECTED NONE DETECTED   Amphetamines, Ur Screen NONE DETECTED NONE DETECTED   MDMA (Ecstasy)Ur Screen NONE DETECTED NONE DETECTED   Cocaine Metabolite,Ur [place] NONE DETECTED NONE DETECTED   Opiate, Ur Screen NONE DETECTED NONE DETECTED   Phencyclidine (PCP) Ur S NONE DETECTED NONE DETECTED   Cannabinoid 50 Ng, Ur  POSITIVE (A) NONE DETECTED   Barbiturates, Ur Screen NONE DETECTED NONE DETECTED   Benzodiazepine, Ur Scrn NONE DETECTED NONE DETECTED   Methadone Scn, Ur NONE DETECTED NONE DETECTED    Comment: (NOTE) Tricyclics + metabolites, urine    Cutoff 1000 ng/mL Amphetamines + metabolites, urine  Cutoff 1000 ng/mL MDMA (Ecstasy), urine              Cutoff 500 ng/mL Cocaine Metabolite, urine          Cutoff 300 ng/mL Opiate + metabolites, urine        Cutoff 300  ng/mL Phencyclidine (PCP), urine         Cutoff 25 ng/mL Cannabinoid, urine                 Cutoff 50 ng/mL Barbiturates + metabolites, urine  Cutoff 200 ng/mL Benzodiazepine, urine              Cutoff 200 ng/mL Methadone, urine                   Cutoff 300 ng/mL The urine drug screen provides only a preliminary, unconfirmed analytical test result and should not be used for non-medical purposes. Clinical consideration and professional judgment should be applied to any positive drug screen result due to possible interfering substances. A more specific alternate chemical method must be used in order to obtain a confirmed analytical result. Gas chromatography / mass spectrometry (GC/MS) is the  preferred confirmat ory method. Performed at [facility], [address] [place], [place] [address]   Magnesium     Status: None   Collection Time: 01/30/19 10:20 PM  Result Value Ref Range   Magnesium 2.1 1.7 - 2.4 mg/dL    Comment: Performed at [facility], [address] [place], [place] [address]  Phosphorus     Status: None   Collection Time: 01/30/19 10:20 PM  Result Value Ref Range   Phosphorus 4.1 2.5 - 4.6 mg/dL    Comment: Performed at [facility], [address] [place], [place] [address]  Protime-INR     Status: None   Collection Time: 01/30/19 10:20 PM  Result Value Ref Range   Prothrombin Time 12.7 11.4 - 15.2 seconds   INR 0.96     Comment: Performed at [facility], [address] [place], [place] [address]  CK     Status: Abnormal   Collection Time: 01/30/19 10:20 PM  Result Value Ref Range   Total CK >50,000 (H) 49 - 397 U/L    Comment: RESULT CONFIRMED BY MANUAL DILUTION.  TFK Performed at [facility], [address] [place], [place] [address]   Comprehensive metabolic panel     Status: Abnormal   Collection Time: 01/30/19 10:20 PM  Result Value Ref Range   Sodium 140 135 - 145 mmol/L   Potassium 3.6 3.5 -  5.1 mmol/L   Chloride 100 98 - 111 mmol/L   CO2 28 22 - 32 mmol/L   Glucose, Bld 105 (H) 70 - 99 mg/dL   BUN 9 6 - 20 mg/dL   Creatinine, Ser 5.53 0.61 - 1.24 mg/dL   Calcium 9.8 8.9 - 74.8 mg/dL   Total Protein 7.2 6.5 - 8.1 g/dL   Albumin 4.2 3.5 - 5.0 g/dL   AST 270 (H) 15 - 41 U/L   ALT 244 (H) 0 - 44 U/L   Alkaline Phosphatase 39 38 - 126 U/L   Total Bilirubin 0.6 0.3 - 1.2 mg/dL   GFR calc non Af Amer >60 >60 mL/min   GFR calc Af Amer >60 >60 mL/min   Anion gap 12 5 - 15    Comment: Performed at [facility], [address] [place], [place] [address]  Blood gas, arterial     Status: Abnormal (Preliminary result)   Collection Time: 01/31/19 12:31 AM  Result Value Ref Range   FIO2 0.21    pH, Arterial 7.50 (H) 7.350 - 7.450   pCO2 arterial 49 (H) 32.0 - 48.0 mmHg   pO2, Arterial 79 (L) 83.0 - 108.0 mmHg   Bicarbonate 38.2 (H) 20.0 - 28.0 mmol/L   Acid-Base Excess 13.0 (H) 0.0 - 2.0 mmol/L   O2 Saturation 96.6 %   Patient temperature 37.0    Collection site RIGHT RADIAL    Sample type ARTERIAL DRAW    Allens test (pass/fail) PASS PASS    Comment: Performed at [facility], [address] [place], [place] [address]   Mechanical Rate PENDING

## 2019-01-31 ENCOUNTER — Inpatient Hospital Stay: Payer: Medicaid Other

## 2019-01-31 LAB — COMPREHENSIVE METABOLIC PANEL
ALT: 244 U/L — ABNORMAL HIGH (ref 0–44)
ALT: 230 U/L — ABNORMAL HIGH (ref 0–44)
AST: 661 U/L — ABNORMAL HIGH (ref 15–41)
AST: 606 U/L — ABNORMAL HIGH (ref 15–41)
Albumin: 4.2 g/dL (ref 3.5–5.0)
Albumin: 4 g/dL (ref 3.5–5.0)
Alkaline Phosphatase: 39 U/L (ref 38–126)
Alkaline Phosphatase: 36 U/L — ABNORMAL LOW (ref 38–126)
Anion gap: 12 (ref 5–15)
Anion gap: 5 (ref 5–15)
BILIRUBIN TOTAL: 0.6 mg/dL (ref 0.3–1.2)
BUN: 9 mg/dL (ref 6–20)
BUN: 8 mg/dL (ref 6–20)
CO2: 28 mmol/L (ref 22–32)
CO2: 31 mmol/L (ref 22–32)
Calcium: 9.8 mg/dL (ref 8.9–10.3)
Calcium: 9.7 mg/dL (ref 8.9–10.3)
Chloride: 100 mmol/L (ref 98–111)
Chloride: 104 mmol/L (ref 98–111)
Creatinine, Ser: 1.04 mg/dL (ref 0.61–1.24)
Creatinine, Ser: 0.96 mg/dL (ref 0.61–1.24)
GFR calc Af Amer: >60 mL/min (ref >60)
GFR calc Af Amer: >60 mL/min (ref >60)
GFR calc non Af Amer: >60 mL/min (ref >60)
GFR calc non Af Amer: >60 mL/min (ref >60)
Glucose, Bld: 105 mg/dL — ABNORMAL HIGH (ref 70–99)
Glucose, Bld: 95 mg/dL (ref 70–99)
POTASSIUM: 4.3 mmol/L (ref 3.5–5.1)
Potassium: 3.6 mmol/L (ref 3.5–5.1)
SODIUM: 140 mmol/L (ref 135–145)
Sodium: 140 mmol/L (ref 135–145)
TOTAL PROTEIN: 7.2 g/dL (ref 6.5–8.1)
Total Bilirubin: 0.9 mg/dL (ref 0.3–1.2)
Total Protein: 7.1 g/dL (ref 6.5–8.1)

## 2019-01-31 LAB — CBC
HEMATOCRIT: 40.1 % (ref 39.0–52.0)
HEMOGLOBIN: 13.1 g/dL (ref 13.0–17.0)
MCH: 29.2 pg (ref 26.0–34.0)
MCHC: 32.7 g/dL (ref 30.0–36.0)
MCV: 89.5 fL (ref 80.0–100.0)
Platelets: 151 10*3/uL (ref 150–400)
RBC: 4.48 MIL/uL (ref 4.22–5.81)
RDW: 12.2 % (ref 11.5–15.5)
WBC: 3.8 10*3/uL — ABNORMAL LOW (ref 4.0–10.5)
nRBC: 0 % (ref 0.0–0.2)

## 2019-01-31 LAB — CK
Total CK: >50000 U/L — ABNORMAL HIGH (ref 49–397)
Total CK: 46549 U/L — ABNORMAL HIGH (ref 49–397)
Total CK: 42046 U/L — ABNORMAL HIGH (ref 49–397)

## 2019-01-31 IMAGING — CR DG HIP (WITH OR WITHOUT PELVIS) 2-3V*L*
1 series · 3 of 3 positions shown · non-contrast
Comparison: None.

CLINICAL DATA: 59-year-old presenting with a three-day history of
left-sided pelvic pain localizing to the anterior superior iliac
spine. The pain began after running track 3 days ago. No known
injury.

EXAM:
DG HIP (WITH OR WITHOUT PELVIS) 2-3V LEFT

[Series 1: dg hip unilat w or w/o pelvis 2-3 views  · non-contrast · 0.14mm/px · 3 of 3 slices shown]
[im 1/3]
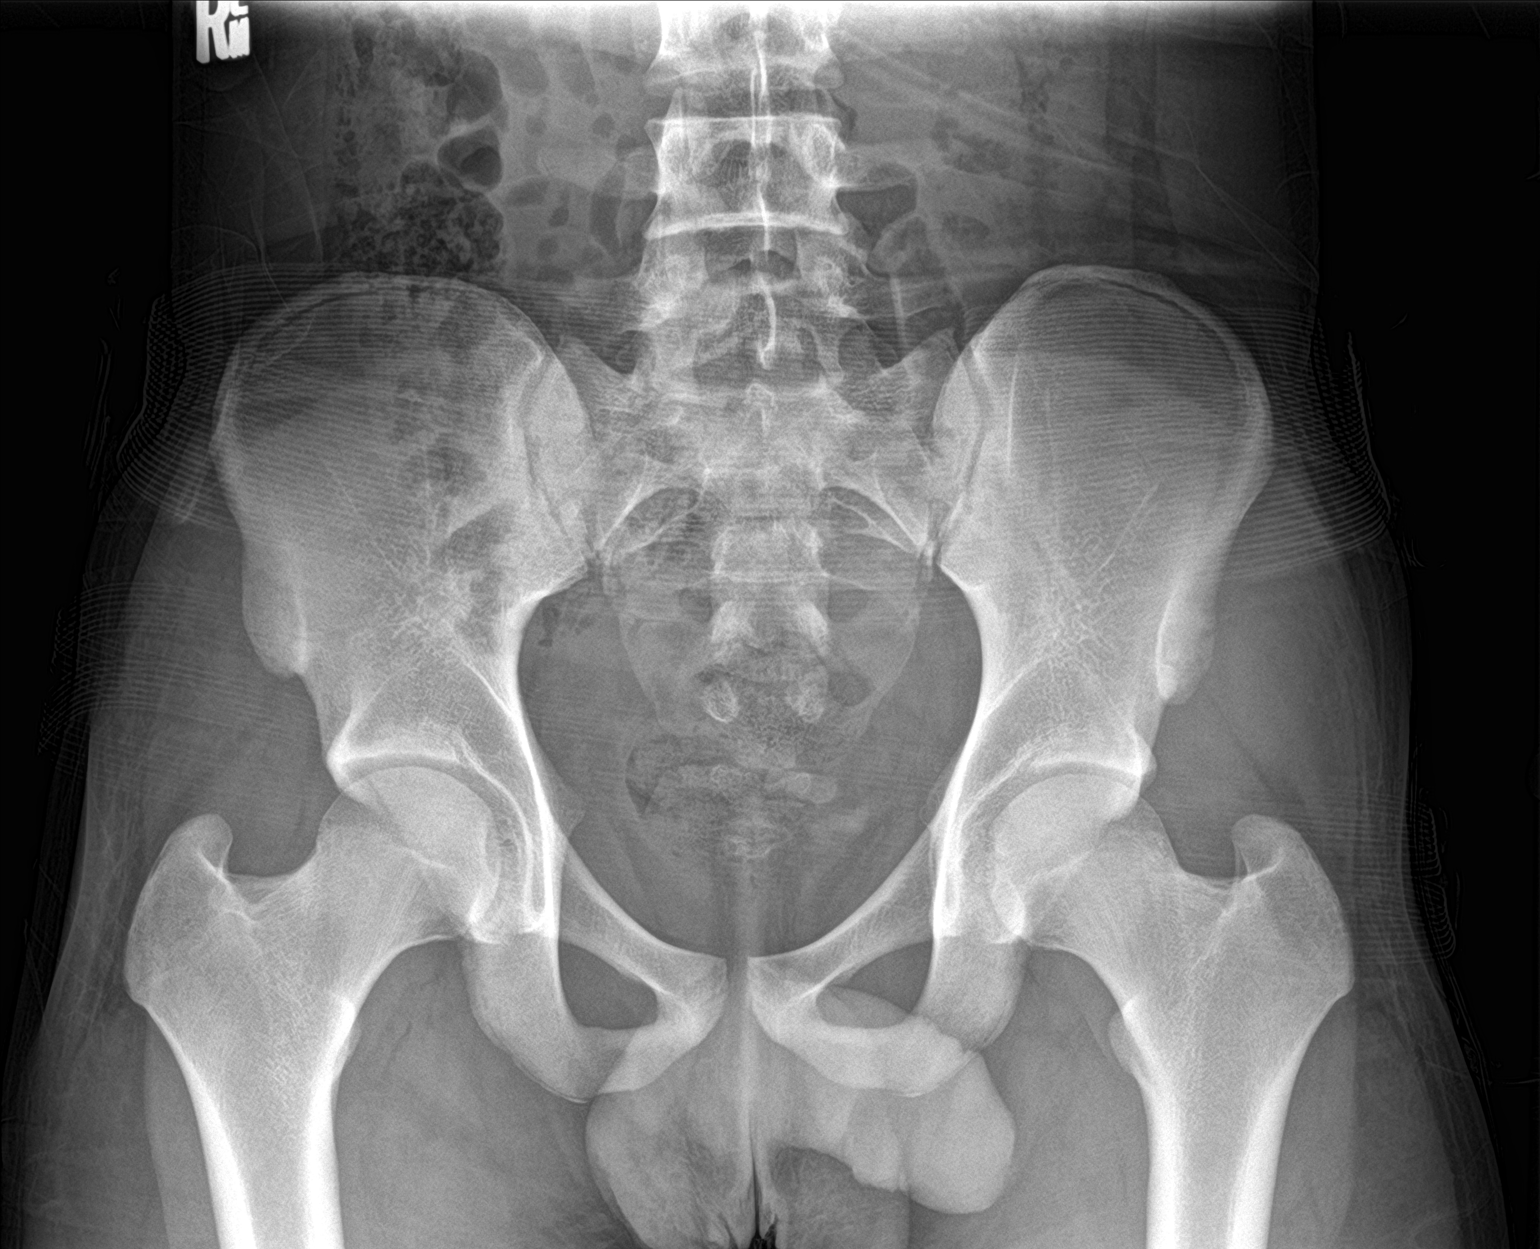
[im 2/3]
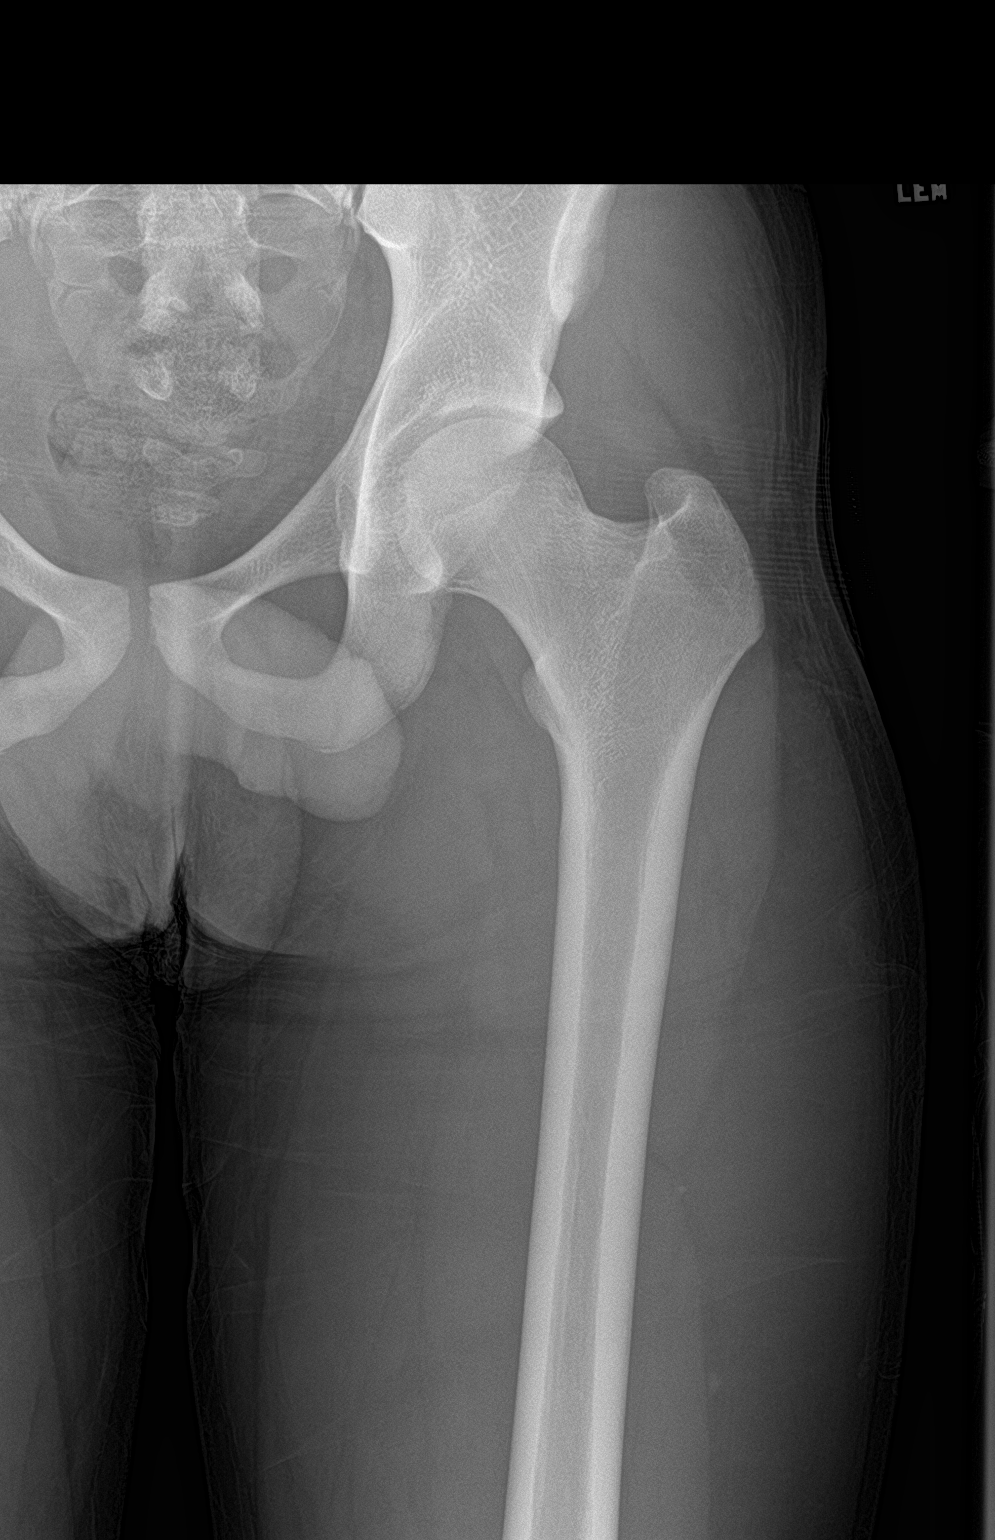
[im 3/3]
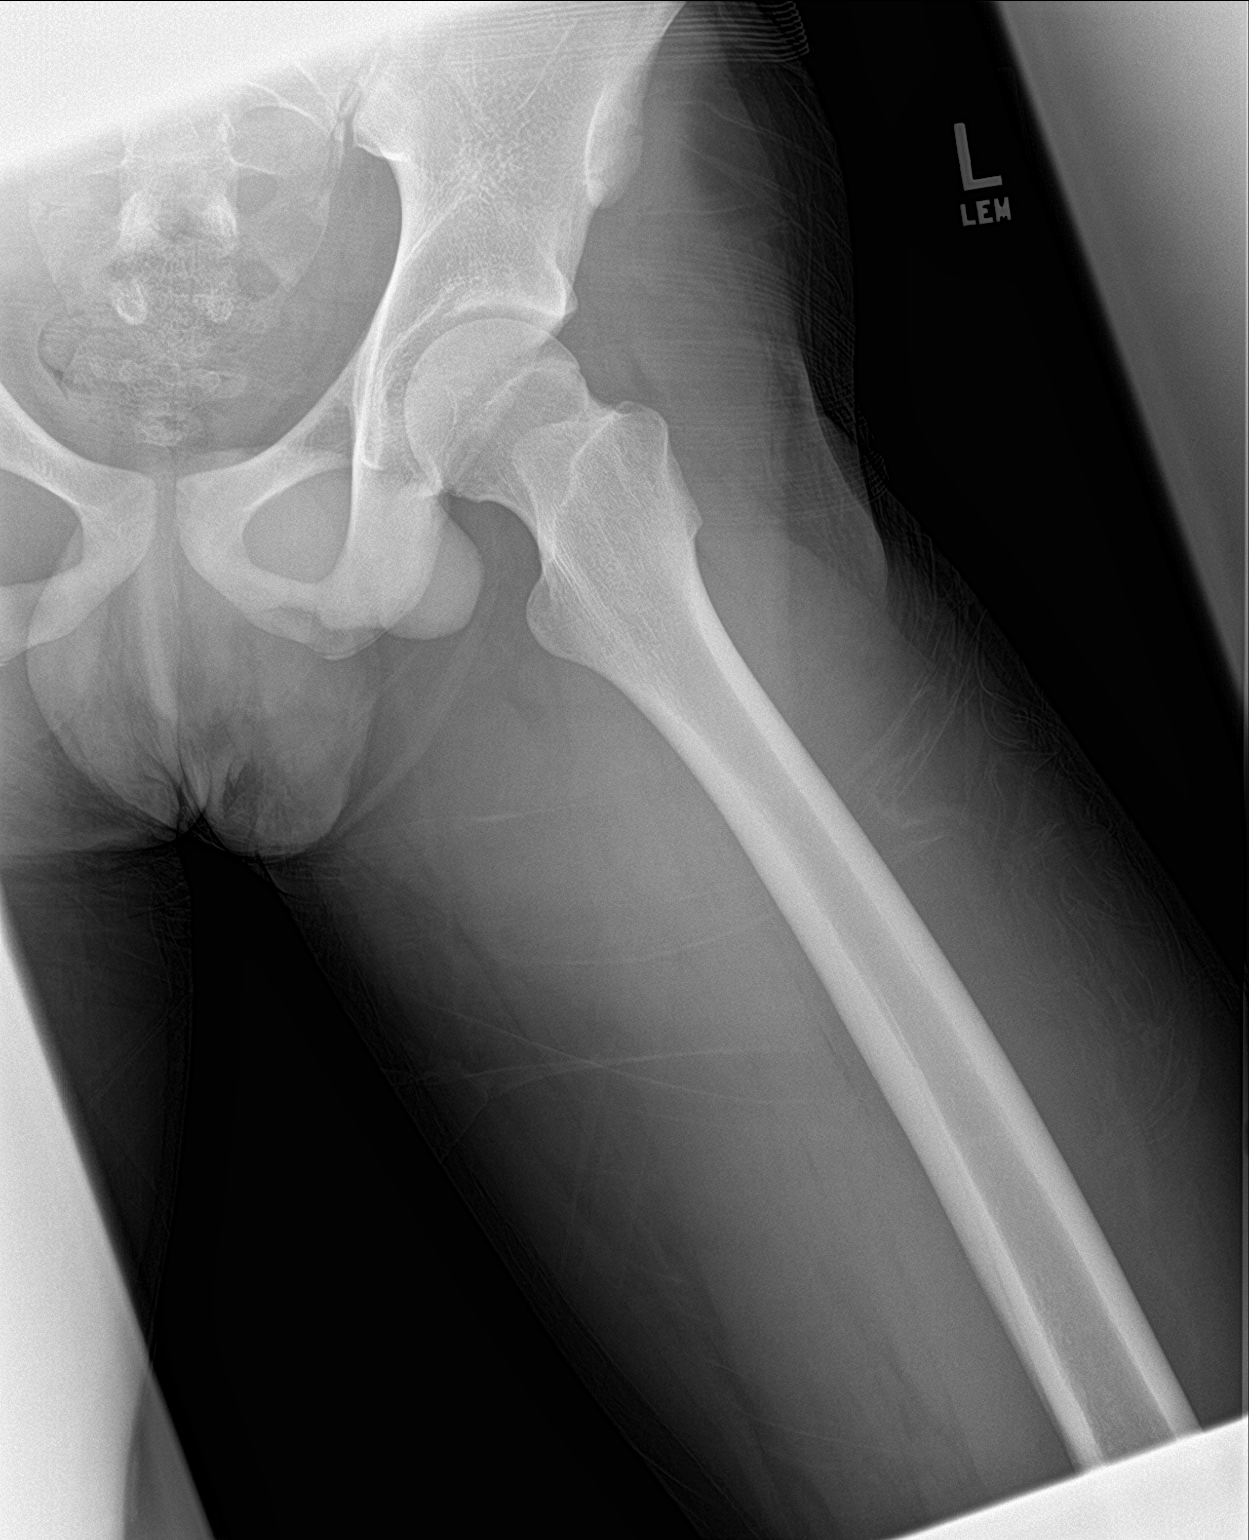

[3 of 3 positions shown; findings below may reference images not displayed]

FINDINGS: No evidence of acute fracture. Well-preserved joint space in the
left hip. No intrinsic osseous abnormality.

Included AP pelvis demonstrates a normal-appearing contralateral
right hip. Sacroiliac joints and symphysis pubis intact. Visualized
lower lumbar spine intact.
IMPRESSION: Normal examination.

## 2019-01-31 MED ORDER — ACETAMINOPHEN 325 MG PO TABS: 325.0000 mg | ORAL_TABLET | Freq: Four times a day (QID) | ORAL | Status: DC | PRN

## 2019-01-31 MED ORDER — SODIUM CHLORIDE 0.9 % IV SOLN
INTRAVENOUS | Status: DC
  Administered 2019-01-31 – 2019-02-05 (×24): via INTRAVENOUS

## 2019-01-31 NOTE — Progress Notes (Signed)
[facility] - [place] at [facility]   PATIENT NAME: Jason Glass    MR#:  [id number]  DATE OF BIRTH:  Oct 23, 2001  SUBJECTIVE:  Patient without complaint, discussed case with patient's mother over the phone-all questions answered, noted transaminitis-most likely secondary to acute rhabdomyolysis, for liver ultrasound and hepatitis work-up  REVIEW OF SYSTEMS:    ROS  CONSTITUTIONAL: No documented fever. Has fatigue, weakness. No weight gain, no weight loss.  EYES: No blurry or double vision.  ENT: No tinnitus. No postnasal drip. No redness of the oropharynx.  RESPIRATORY: No cough, no wheeze, no hemoptysis. No dyspnea.  CARDIOVASCULAR: No chest pain. No orthopnea. No palpitations. No syncope.  GASTROINTESTINAL: No nausea, no vomiting or diarrhea. No abdominal pain. No melena or hematochezia.  GENITOURINARY: No dysuria or hematuria.  ENDOCRINE: No polyuria or nocturia. No heat or cold intolerance.  HEMATOLOGY: No anemia. No bruising. No bleeding.  INTEGUMENTARY: No rashes. No lesions.  MUSCULOSKELETAL: No arthritis. No swelling. No gout.  Muscle aches noted NEUROLOGIC: No numbness, tingling, or ataxia. No seizure-type activity.  PSYCHIATRIC: No anxiety. No insomnia. No ADD.   DRUG ALLERGIES:  No Known Allergies  VITALS:  Blood pressure 124/76, pulse (!) 48, temperature 98.4 F (36.9 C), temperature source Oral, resp. rate 15, height 5\' 8"  (1.727 m), weight 73 kg, SpO2 100 %.  PHYSICAL EXAMINATION:   Physical Exam  GENERAL:  18 y.o.-year-old patient lying in the bed with no acute distress.  EYES: Pupils equal, round, reactive to light and accommodation. No scleral icterus. Extraocular muscles intact.  HEENT: Head atraumatic, normocephalic. Oropharynx and nasopharynx clear.  NECK:  Supple, no jugular venous distention. No thyroid enlargement, no tenderness.  LUNGS: Normal breath sounds bilaterally, no wheezing, rales, rhonchi. No use of accessory muscles of  respiration.  CARDIOVASCULAR: S1, S2 normal. No murmurs, rubs, or gallops.  ABDOMEN: Soft, nontender, nondistended. Bowel sounds present. No organomegaly or mass.  EXTREMITIES: No cyanosis, clubbing or edema b/l.    NEUROLOGIC: Cranial nerves II through XII are intact. No focal Motor or sensory deficits b/l.   PSYCHIATRIC: The patient is alert and oriented x 3.  SKIN: No obvious rash, lesion, or ulcer.   LABORATORY PANEL:   CBC Recent Labs  Lab 01/31/19 0740  WBC 3.8*  HGB 13.1  HCT 40.1  PLT 151   ------------------------------------------------------------------------------------------------------------------ Chemistries  Recent Labs  Lab 01/30/19 2220 01/31/19 0740  NA 140 140  K 3.6 4.3  CL 100 104  CO2 28 31  GLUCOSE 105* 95  BUN 9 8  CREATININE 1.04 0.96  CALCIUM 9.8 9.7  MG 2.1  --   AST 661* 606*  ALT 244* 230*  ALKPHOS 39 36*  BILITOT 0.6 0.9   ------------------------------------------------------------------------------------------------------------------  Cardiac Enzymes No results for input(s): TROPONINI in the last 168 hours. ------------------------------------------------------------------------------------------------------------------  RADIOLOGY:  No results found.   ASSESSMENT AND PLAN:  18 year old male patient with no significant past medical history currently under hospitalist service  *Acute rhabdomyolysis Resolving Secondary to aggressive weight training  Continue IV fluids for rehydration, CPK twice daily, BMP daily, avoid nephrotoxic agents  *Acute generalized body aches  Resolved secondary to rhabdomyolysis Continue current regiment  *Acute hypokalemia Repleted  *Acute transaminitis Most likely secondary to acute rhabdomyolysis Follow-up on liver ultrasound and hepatitis panel, avoid hepatotoxic agents  DVT prophylaxis sequential compression device to lower extremities  All the records are reviewed and case discussed  with Care Management/Social Worker. Management plans discussed with the patient, family and they  are in agreement.  CODE STATUS:  Full code  DVT Prophylaxis: SCDs  TOTAL TIME TAKING CARE OF THIS PATIENT: 35 minutes.   POSSIBLE D/C IN 1 to 3 DAYS, DEPENDING ON CLINICAL CONDITION.  Evelena Asa Salary M.D on 01/31/2019 at 11:29 AM  Between 7am to 6pm - Pager - [phone]  After 6pm go to [email or web address] - password [email or web address]  [facility]  Office  [phone]  CC: Primary care physician; Patient, No Pcp Per  Note: This dictation was prepared with Dragon dictation along with smaller phrase technology. Any transcriptional errors that result from this process are unintentional.

## 2019-02-01 LAB — CK
Total CK: 33624 U/L — ABNORMAL HIGH (ref 49–397)
Total CK: 30927 U/L — ABNORMAL HIGH (ref 49–397)

## 2019-02-01 NOTE — Progress Notes (Signed)
   02/01/19 1400  Clinical Encounter Type  Visited With Patient and family together  Visit Type Initial  Ch made a visit during rounds. Pt was awake with his mother. Pt and mother said they were doing fine.

## 2019-02-01 NOTE — Progress Notes (Addendum)
[facility] - [place] at [facility]   PATIENT NAME: Jason Glass    MR#:  [id number]  DATE OF BIRTH:  06-Jan-2001  SUBJECTIVE:  Patient without complaint, discussed case with patient's mother over the phone-all questions answered, liver ultrasound noted, follow-up on hepatitis panel, CPKs improving  REVIEW OF SYSTEMS:    ROS  CONSTITUTIONAL: No documented fever. Has fatigue, weakness. No weight gain, no weight loss.  EYES: No blurry or double vision.  ENT: No tinnitus. No postnasal drip. No redness of the oropharynx.  RESPIRATORY: No cough, no wheeze, no hemoptysis. No dyspnea.  CARDIOVASCULAR: No chest pain. No orthopnea. No palpitations. No syncope.  GASTROINTESTINAL: No nausea, no vomiting or diarrhea. No abdominal pain. No melena or hematochezia.  GENITOURINARY: No dysuria or hematuria.  ENDOCRINE: No polyuria or nocturia. No heat or cold intolerance.  HEMATOLOGY: No anemia. No bruising. No bleeding.  INTEGUMENTARY: No rashes. No lesions.  MUSCULOSKELETAL: No arthritis. No swelling. No gout.  Muscle aches noted NEUROLOGIC: No numbness, tingling, or ataxia. No seizure-type activity.  PSYCHIATRIC: No anxiety. No insomnia. No ADD.   DRUG ALLERGIES:  No Known Allergies  VITALS:  Blood pressure 131/64, pulse (!) 45, temperature (!) 97.5 F (36.4 C), temperature source Oral, resp. rate 18, height 5\' 8"  (1.727 m), weight 73 kg, SpO2 100 %.  PHYSICAL EXAMINATION:   Physical Exam  GENERAL:  18 y.o.-year-old patient lying in the bed with no acute distress.  EYES: Pupils equal, round, reactive to light and accommodation. No scleral icterus. Extraocular muscles intact.  HEENT: Head atraumatic, normocephalic. Oropharynx and nasopharynx clear.  NECK:  Supple, no jugular venous distention. No thyroid enlargement, no tenderness.  LUNGS: Normal breath sounds bilaterally, no wheezing, rales, rhonchi. No use of accessory muscles of respiration.  CARDIOVASCULAR: S1, S2  normal. No murmurs, rubs, or gallops.  ABDOMEN: Soft, nontender, nondistended. Bowel sounds present. No organomegaly or mass.  EXTREMITIES: No cyanosis, clubbing or edema b/l.    NEUROLOGIC: Cranial nerves II through XII are intact. No focal Motor or sensory deficits b/l.   PSYCHIATRIC: The patient is alert and oriented x 3.  SKIN: No obvious rash, lesion, or ulcer.   LABORATORY PANEL:   CBC Recent Labs  Lab 01/31/19 0740  WBC 3.8*  HGB 13.1  HCT 40.1  PLT 151   ------------------------------------------------------------------------------------------------------------------ Chemistries  Recent Labs  Lab 01/30/19 2220 01/31/19 0740  NA 140 140  K 3.6 4.3  CL 100 104  CO2 28 31  GLUCOSE 105* 95  BUN 9 8  CREATININE 1.04 0.96  CALCIUM 9.8 9.7  MG 2.1  --   AST 661* 606*  ALT 244* 230*  ALKPHOS 39 36*  BILITOT 0.6 0.9   ------------------------------------------------------------------------------------------------------------------  Cardiac Enzymes No results for input(s): TROPONINI in the last 168 hours. ------------------------------------------------------------------------------------------------------------------  RADIOLOGY:  US Liver Doppler  Result Date: 01/31/2019 CLINICAL DATA:  Hepatitis. EXAM: DUPLEX ULTRASOUND OF LIVER TECHNIQUE: Color and duplex Doppler ultrasound was performed to evaluate the hepatic in-flow and out-flow vessels. COMPARISON:  None. FINDINGS: Liver: Normal parenchymal echogenicity. Normal hepatic contour without nodularity. No focal lesion, mass or intrahepatic biliary ductal dilatation. Main Portal Vein size: 1.0 cm Portal Vein Velocities Main Prox:  65 cm/sec Main Mid: 44 cm/sec Main Dist:  51 cm/sec Right: 13 cm/sec Left: 15 cm/sec Normal hepatopetal flow is noted in the portal veins. Hepatic Vein Velocities Right:  27 cm/sec Middle:  37 cm/sec Left:  52 cm/sec Normal hepatofugal flow is noted in the hepatic veins.  IVC: Present and patent  with normal respiratory phasicity. Hepatic Artery Velocity:  93 cm/sec Splenic Vein Velocity:  15 cm/sec Spleen: 7.8 cm x 3.4 cm x 8.3 cm with a total volume of 117 cm^3 (411 cm^3 is upper limit normal) Portal Vein Occlusion/Thrombus: No Splenic Vein Occlusion/Thrombus: No Ascites: None Varices: None IMPRESSION: No evidence of hepatic, portal or splenic venous thrombosis or occlusion. Grossly normal spleen and liver. Electronically Signed   By: Lupita Raider, M.D.   On: 01/31/2019 12:44     ASSESSMENT AND PLAN:  18 year old male patient with no significant past medical history currently under hospitalist service  *Acute rhabdomyolysis Resolving Secondary to aggressive weight training  Continue IV fluids for rehydration, CPK twice daily, CMP in a.m. given transaminitis, continue to avoid nephrotoxic agents  *Acute generalized body aches  Resolved secondary to rhabdomyolysis Continue current regiment  *Acute hypokalemia Repleted  *Acute transaminitis Solving Most likely secondary to acute rhabdomyolysis Liver ultrasound was unimpressive, follow-up on hepatitis panel, avoid hepatotoxic agents, CMP in the morning  DVT prophylaxis sequential compression device to lower extremities  All the records are reviewed and case discussed with Care Management/Social Worker. Management plans discussed with the patient, family and they are in agreement.  CODE STATUS:  Full code  DVT Prophylaxis: SCDs  TOTAL TIME TAKING CARE OF THIS PATIENT: 35 minutes.   POSSIBLE D/C IN 1 to 3 DAYS, DEPENDING ON CLINICAL CONDITION.  Evelena Asa  M.D on 02/01/2019 at 10:39 AM  Between 7am to 6pm - Pager - [phone]  After 6pm go to [email or web address] - password [email or web address]  [facility] [place] [facility]  Office  [phone]  CC: Primary care physician; Patient, No Pcp Per  Note: This dictation was prepared with Dragon dictation along with smaller phrase technology. Any transcriptional errors that  result from this process are unintentional.

## 2019-02-01 NOTE — Plan of Care (Signed)

## 2019-02-02 LAB — BLOOD GAS, ARTERIAL
Acid-Base Excess: 13 mmol/L — ABNORMAL HIGH (ref 0.0–2.0)
Bicarbonate: 38.2 mmol/L — ABNORMAL HIGH (ref 20.0–28.0)
FIO2: 0.21
O2 Saturation: 96.6 %
PCO2 ART: 49 mmHg — AB (ref 32.0–48.0)
Patient temperature: 37
pH, Arterial: 7.5 — ABNORMAL HIGH (ref 7.350–7.450)
pO2, Arterial: 79 mmHg — ABNORMAL LOW (ref 83.0–108.0)

## 2019-02-02 LAB — COMPREHENSIVE METABOLIC PANEL
ALT: 219 U/L — ABNORMAL HIGH (ref 0–44)
AST: 377 U/L — ABNORMAL HIGH (ref 15–41)
Albumin: 4 g/dL (ref 3.5–5.0)
Alkaline Phosphatase: 37 U/L — ABNORMAL LOW (ref 38–126)
Anion gap: 6 (ref 5–15)
BILIRUBIN TOTAL: 0.9 mg/dL (ref 0.3–1.2)
BUN: 12 mg/dL (ref 6–20)
CO2: 24 mmol/L (ref 22–32)
Calcium: 9.9 mg/dL (ref 8.9–10.3)
Chloride: 108 mmol/L (ref 98–111)
Creatinine, Ser: 0.96 mg/dL (ref 0.61–1.24)
GFR calc Af Amer: >60 mL/min (ref >60)
GFR calc non Af Amer: >60 mL/min (ref >60)
Glucose, Bld: 85 mg/dL (ref 70–99)
Potassium: 4.1 mmol/L (ref 3.5–5.1)
Sodium: 138 mmol/L (ref 135–145)
TOTAL PROTEIN: 7.3 g/dL (ref 6.5–8.1)

## 2019-02-02 LAB — HEPATITIS PANEL, ACUTE
HCV Ab: <0.1 s/co ratio (ref 0.0–0.9)
HEP A IGM: NEGATIVE
Hep B C IgM: NEGATIVE
Hepatitis B Surface Ag: NEGATIVE

## 2019-02-02 LAB — CK
CK TOTAL: 21412 U/L — AB (ref 49–397)
Total CK: 15425 U/L — ABNORMAL HIGH (ref 49–397)

## 2019-02-02 NOTE — Plan of Care (Signed)

## 2019-02-02 NOTE — Progress Notes (Addendum)
[facility] - [place] at [facility]   PATIENT NAME: Jason Glass    MR#:  [id number]  DATE OF BIRTH:  2001-12-04  SUBJECTIVE:  Patient without complaint, discussed case with patient's mother over the phone-all questions answered, liver ultrasound noted, CPKs improving  REVIEW OF SYSTEMS:    ROS  CONSTITUTIONAL: No documented fever. Has fatigue, weakness. No weight gain, no weight loss.  EYES: No blurry or double vision.  ENT: No tinnitus. No postnasal drip. No redness of the oropharynx.  RESPIRATORY: No cough, no wheeze, no hemoptysis. No dyspnea.  CARDIOVASCULAR: No chest pain. No orthopnea. No palpitations. No syncope.  GASTROINTESTINAL: No nausea, no vomiting or diarrhea. No abdominal pain. No melena or hematochezia.  GENITOURINARY: No dysuria or hematuria.  ENDOCRINE: No polyuria or nocturia. No heat or cold intolerance.  HEMATOLOGY: No anemia. No bruising. No bleeding.  INTEGUMENTARY: No rashes. No lesions.  MUSCULOSKELETAL: No arthritis. No swelling. No gout.  Muscle aches noted NEUROLOGIC: No numbness, tingling, or ataxia. No seizure-type activity.  PSYCHIATRIC: No anxiety. No insomnia. No ADD.   DRUG ALLERGIES:  No Known Allergies  VITALS:  Blood pressure 128/89, pulse (!) 44, temperature 97.6 F (36.4 C), resp. rate 17, height 5\' 8"  (1.727 m), weight 73 kg, SpO2 100 %.  PHYSICAL EXAMINATION:   Physical Exam  GENERAL:  18 y.o.-year-old patient lying in the bed with no acute distress.  EYES: Pupils equal, round, reactive to light and accommodation. No scleral icterus. Extraocular muscles intact.  HEENT: Head atraumatic, normocephalic. Oropharynx and nasopharynx clear.  NECK:  Supple, no jugular venous distention. No thyroid enlargement, no tenderness.  LUNGS: Normal breath sounds bilaterally, no wheezing, rales, rhonchi. No use of accessory muscles of respiration.  CARDIOVASCULAR: S1, S2 normal. No murmurs, rubs, or gallops.  ABDOMEN: Soft,  nontender, nondistended. Bowel sounds present. No organomegaly or mass.  EXTREMITIES: No cyanosis, clubbing or edema b/l.    NEUROLOGIC: Cranial nerves II through XII are intact. No focal Motor or sensory deficits b/l.   PSYCHIATRIC: The patient is alert and oriented x 3.  SKIN: No obvious rash, lesion, or ulcer.   LABORATORY PANEL:   CBC Recent Labs  Lab 01/31/19 0740  WBC 3.8*  HGB 13.1  HCT 40.1  PLT 151   ------------------------------------------------------------------------------------------------------------------ Chemistries  Recent Labs  Lab 01/30/19 2220  02/02/19 0751  NA 140   < > 138  K 3.6   < > 4.1  CL 100   < > 108  CO2 28   < > 24  GLUCOSE 105*   < > 85  BUN 9   < > 12  CREATININE 1.04   < > 0.96  CALCIUM 9.8   < > 9.9  MG 2.1  --   --   AST 661*   < > 377*  ALT 244*   < > 219*  ALKPHOS 39   < > 37*  BILITOT 0.6   < > 0.9   < > = values in this interval not displayed.   ------------------------------------------------------------------------------------------------------------------  Cardiac Enzymes No results for input(s): TROPONINI in the last 168 hours. ------------------------------------------------------------------------------------------------------------------  RADIOLOGY:  No results found.   ASSESSMENT AND PLAN:  18 year old male patient with no significant past medical history currently under hospitalist service  *Acute rhabdomyolysis Resolving Secondary to aggressive weight training  Continue IV fluids for rehydration, CPK twice daily, BMP in a.m.  *Acute generalized body aches  Resolved secondary to rhabdomyolysis Continue current regiment  *Acute hypokalemia Repleted  *  Acute transaminitis Resolving Most likely secondary to acute rhabdomyolysis Liver ultrasound was unimpressive, hepatitis panel negative, avoid hepatotoxic agents, and continue close medical monitoring   *Positive urine drug test for marijuana   Assertive medical management   DVT prophylaxis sequential compression device to lower extremities  All the records are reviewed and case discussed with Care Management/Social Worker. Management plans discussed with the patient, family and they are in agreement.  CODE STATUS:  Full code  DVT Prophylaxis: SCDs  TOTAL TIME TAKING CARE OF THIS PATIENT: 35 minutes.   POSSIBLE D/C IN 1 to 3 DAYS, DEPENDING ON CLINICAL CONDITION.  Evelena AsaMontell D Yannis Gumbs M.D on 02/02/2019 at 10:51 AM  Between 7am to 6pm - Pager - [phone]  After 6pm go to [email or web address] - password [email or web address]  [facility] [place] [facility]  Office  [phone]  CC: Primary care physician; Patient, No Pcp Per  Note: This dictation was prepared with Dragon dictation along with smaller phrase technology. Any transcriptional errors that result from this process are unintentional.

## 2019-02-03 LAB — BASIC METABOLIC PANEL
Anion gap: 3 — ABNORMAL LOW (ref 5–15)
BUN: 10 mg/dL (ref 6–20)
CO2: 26 mmol/L (ref 22–32)
Calcium: 9.5 mg/dL (ref 8.9–10.3)
Chloride: 108 mmol/L (ref 98–111)
Creatinine, Ser: 0.88 mg/dL (ref 0.61–1.24)
GFR calc Af Amer: >60 mL/min (ref >60)
GFR calc non Af Amer: >60 mL/min (ref >60)
Glucose, Bld: 87 mg/dL (ref 70–99)
Potassium: 3.7 mmol/L (ref 3.5–5.1)
Sodium: 137 mmol/L (ref 135–145)

## 2019-02-03 LAB — CK
Total CK: 10232 U/L — ABNORMAL HIGH (ref 49–397)
Total CK: 7441 U/L — ABNORMAL HIGH (ref 49–397)

## 2019-02-03 NOTE — Progress Notes (Addendum)
[facility] - [place] at [facility]   PATIENT NAME: Jason Glass    MR#:  [id number]  DATE OF BIRTH:  07/23/2001  SUBJECTIVE:  Patient without complaint, discussed case with patient's mother over the phone-all questions answered, CPKs improving  REVIEW OF SYSTEMS:    ROS  CONSTITUTIONAL: No documented fever. Has fatigue, weakness. No weight gain, no weight loss.  EYES: No blurry or double vision.  ENT: No tinnitus. No postnasal drip. No redness of the oropharynx.  RESPIRATORY: No cough, no wheeze, no hemoptysis. No dyspnea.  CARDIOVASCULAR: No chest pain. No orthopnea. No palpitations. No syncope.  GASTROINTESTINAL: No nausea, no vomiting or diarrhea. No abdominal pain. No melena or hematochezia.  GENITOURINARY: No dysuria or hematuria.  ENDOCRINE: No polyuria or nocturia. No heat or cold intolerance.  HEMATOLOGY: No anemia. No bruising. No bleeding.  INTEGUMENTARY: No rashes. No lesions.  MUSCULOSKELETAL: No arthritis. No swelling. No gout.  Muscle aches noted NEUROLOGIC: No numbness, tingling, or ataxia. No seizure-type activity.  PSYCHIATRIC: No anxiety. No insomnia. No ADD.   DRUG ALLERGIES:  No Known Allergies  VITALS:  Blood pressure 119/64, pulse (!) 52, temperature 98.5 F (36.9 C), temperature source Oral, resp. rate 18, height 5\' 8"  (1.727 m), weight 73 kg, SpO2 99 %.  PHYSICAL EXAMINATION:   Physical Exam  GENERAL:  18 y.o.-year-old patient lying in the bed with no acute distress.  EYES: Pupils equal, round, reactive to light and accommodation. No scleral icterus. Extraocular muscles intact.  HEENT: Head atraumatic, normocephalic. Oropharynx and nasopharynx clear.  NECK:  Supple, no jugular venous distention. No thyroid enlargement, no tenderness.  LUNGS: Normal breath sounds bilaterally, no wheezing, rales, rhonchi. No use of accessory muscles of respiration.  CARDIOVASCULAR: S1, S2 normal. No murmurs, rubs, or gallops.  ABDOMEN: Soft,  nontender, nondistended. Bowel sounds present. No organomegaly or mass.  EXTREMITIES: No cyanosis, clubbing or edema b/l.    NEUROLOGIC: Cranial nerves II through XII are intact. No focal Motor or sensory deficits b/l.   PSYCHIATRIC: The patient is alert and oriented x 3.  SKIN: No obvious rash, lesion, or ulcer.   LABORATORY PANEL:   CBC Recent Labs  Lab 01/31/19 0740  WBC 3.8*  HGB 13.1  HCT 40.1  PLT 151   ------------------------------------------------------------------------------------------------------------------ Chemistries  Recent Labs  Lab 01/30/19 2220  02/02/19 0751 02/03/19 0600  NA 140   < > 138 137  K 3.6   < > 4.1 3.7  CL 100   < > 108 108  CO2 28   < > 24 26  GLUCOSE 105*   < > 85 87  BUN 9   < > 12 10  CREATININE 1.04   < > 0.96 0.88  CALCIUM 9.8   < > 9.9 9.5  MG 2.1  --   --   --   AST 661*   < > 377*  --   ALT 244*   < > 219*  --   ALKPHOS 39   < > 37*  --   BILITOT 0.6   < > 0.9  --    < > = values in this interval not displayed.   ------------------------------------------------------------------------------------------------------------------  Cardiac Enzymes No results for input(s): TROPONINI in the last 168 hours. ------------------------------------------------------------------------------------------------------------------  RADIOLOGY:  No results found.   ASSESSMENT AND PLAN:  18 year old male patient with no significant past medical history currently under hospitalist service  *Acute rhabdomyolysis Resolving Secondary to aggressive weight training  Continue IV fluids for  rehydration, CPK trending downward, BMP in a.m.  *Acute generalized body aches  Resolved secondary to rhabdomyolysis Continue current regiment  *Acute hypokalemia Repleted  *Acute transaminitis Resolving Most likely secondary to acute rhabdomyolysis Liver ultrasound was unimpressive, hepatitis panel negative, avoid hepatotoxic agents, and continue  close medical monitoring   *Positive urine drug test for marijuana  Conservative medical management   DVT prophylaxis sequential compression device to lower extremities  All the records are reviewed and case discussed with Care Management/Social Worker. Management plans discussed with the patient, family and they are in agreement.  CODE STATUS:  Full code  DVT Prophylaxis: SCDs  TOTAL TIME TAKING CARE OF THIS PATIENT: 30 minutes.   POSSIBLE D/C IN 1 to 2 DAYS, DEPENDING ON CLINICAL CONDITION.  Evelena Asa  M.D on 02/03/2019 at 12:06 PM  Between 7am to 6pm - Pager - [phone]  After 6pm go to [email or web address] - password [email or web address]  [facility] [place] [facility]  Office  [phone]  CC: Primary care physician; Patient, No Pcp Per  Note: This dictation was prepared with Dragon dictation along with smaller phrase technology. Any transcriptional errors that result from this process are unintentional.

## 2019-02-04 LAB — BASIC METABOLIC PANEL
Anion gap: 5 (ref 5–15)
BUN: 10 mg/dL (ref 6–20)
CO2: 26 mmol/L (ref 22–32)
Calcium: 9.6 mg/dL (ref 8.9–10.3)
Chloride: 108 mmol/L (ref 98–111)
Creatinine, Ser: 0.92 mg/dL (ref 0.61–1.24)
GFR calc Af Amer: >60 mL/min (ref >60)
GFR calc non Af Amer: >60 mL/min (ref >60)
Glucose, Bld: 90 mg/dL (ref 70–99)
Potassium: 3.5 mmol/L (ref 3.5–5.1)
Sodium: 139 mmol/L (ref 135–145)

## 2019-02-04 LAB — CK
Total CK: 6440 U/L — ABNORMAL HIGH (ref 49–397)
Total CK: 5296 U/L — ABNORMAL HIGH (ref 49–397)

## 2019-02-04 NOTE — Progress Notes (Addendum)
[facility] - [place] at [facility]   PATIENT NAME: Jason Glass    MR#:  [id number]  DATE OF BIRTH:  11-Dec-2001  SUBJECTIVE:  Patient without complaint, discussed case with patient's mother over the phone-all questions answered, CPKs improving  REVIEW OF SYSTEMS:    ROS  CONSTITUTIONAL: No documented fever. Has fatigue, weakness. No weight gain, no weight loss.  EYES: No blurry or double vision.  ENT: No tinnitus. No postnasal drip. No redness of the oropharynx.  RESPIRATORY: No cough, no wheeze, no hemoptysis. No dyspnea.  CARDIOVASCULAR: No chest pain. No orthopnea. No palpitations. No syncope.  GASTROINTESTINAL: No nausea, no vomiting or diarrhea. No abdominal pain. No melena or hematochezia.  GENITOURINARY: No dysuria or hematuria.  ENDOCRINE: No polyuria or nocturia. No heat or cold intolerance.  HEMATOLOGY: No anemia. No bruising. No bleeding.  INTEGUMENTARY: No rashes. No lesions.  MUSCULOSKELETAL: No arthritis. No swelling. No gout.  Muscle aches noted NEUROLOGIC: No numbness, tingling, or ataxia. No seizure-type activity.  PSYCHIATRIC: No anxiety. No insomnia. No ADD.   DRUG ALLERGIES:  No Known Allergies  VITALS:  Blood pressure 130/73, pulse (!) 59, temperature 98.1 F (36.7 C), resp. rate 18, height 5\' 8"  (1.727 m), weight 73 kg, SpO2 100 %.  PHYSICAL EXAMINATION:   Physical Exam  GENERAL:  18 y.o.-year-old patient lying in the bed with no acute distress.  EYES: Pupils equal, round, reactive to light and accommodation. No scleral icterus. Extraocular muscles intact.  HEENT: Head atraumatic, normocephalic. Oropharynx and nasopharynx clear.  NECK:  Supple, no jugular venous distention. No thyroid enlargement, no tenderness.  LUNGS: Normal breath sounds bilaterally, no wheezing, rales, rhonchi. No use of accessory muscles of respiration.  CARDIOVASCULAR: S1, S2 normal. No murmurs, rubs, or gallops.  ABDOMEN: Soft, nontender, nondistended. Bowel  sounds present. No organomegaly or mass.  EXTREMITIES: No cyanosis, clubbing or edema b/l.    NEUROLOGIC: Cranial nerves II through XII are intact. No focal Motor or sensory deficits b/l.   PSYCHIATRIC: The patient is alert and oriented x 3.  SKIN: No obvious rash, lesion, or ulcer.   LABORATORY PANEL:   CBC Recent Labs  Lab 01/31/19 0740  WBC 3.8*  HGB 13.1  HCT 40.1  PLT 151   ------------------------------------------------------------------------------------------------------------------ Chemistries  Recent Labs  Lab 01/30/19 2220  02/02/19 0751  02/04/19 0400  NA 140   < > 138   < > 139  K 3.6   < > 4.1   < > 3.5  CL 100   < > 108   < > 108  CO2 28   < > 24   < > 26  GLUCOSE 105*   < > 85   < > 90  BUN 9   < > 12   < > 10  CREATININE 1.04   < > 0.96   < > 0.92  CALCIUM 9.8   < > 9.9   < > 9.6  MG 2.1  --   --   --   --   AST 661*   < > 377*  --   --   ALT 244*   < > 219*  --   --   ALKPHOS 39   < > 37*  --   --   BILITOT 0.6   < > 0.9  --   --    < > = values in this interval not displayed.   ------------------------------------------------------------------------------------------------------------------  Cardiac Enzymes No results for input(s): TROPONINI  in the last 168 hours. ------------------------------------------------------------------------------------------------------------------  RADIOLOGY:  No results found.   ASSESSMENT AND PLAN:  18 year old male patient with no significant past medical history currently under hospitalist service  *Acute rhabdomyolysis Resolving Secondary to aggressive weight training  Continue IV fluids for rehydration, CPK trending downward-currently greater than 6400, BMP in a.m.  *Acute generalized body aches  Resolved secondary to rhabdomyolysis Continue current regiment  *Acute hypokalemia Repleted  *Acute transaminitis Resolving Most likely secondary to acute rhabdomyolysis Liver ultrasound was  unimpressive, hepatitis panel negative, avoid hepatotoxic agents, and continue close medical monitoring   *Positive urine drug test for marijuana  Conservative medical management   DVT prophylaxis sequential compression device to lower extremities  All the records are reviewed and case discussed with Care Management/Social Worker. Management plans discussed with the patient, family and they are in agreement.  CODE STATUS:  Full code  DVT Prophylaxis: SCDs  TOTAL TIME TAKING CARE OF THIS PATIENT: 30 minutes.   POSSIBLE D/C IN 1 to 2 DAYS, DEPENDING ON CLINICAL CONDITION.  Evelena Asa  M.D on 02/04/2019 at 10:51 AM  Between 7am to 6pm - Pager - [phone]  After 6pm go to [email or web address] - password [email or web address]  [facility] [place] [facility]  Office  [phone]  CC: Primary care physician; Patient, No Pcp Per  Note: This dictation was prepared with Dragon dictation along with smaller phrase technology. Any transcriptional errors that result from this process are unintentional.

## 2019-02-05 LAB — BASIC METABOLIC PANEL
Anion gap: 6 (ref 5–15)
BUN: 9 mg/dL (ref 6–20)
CO2: 25 mmol/L (ref 22–32)
Calcium: 9.8 mg/dL (ref 8.9–10.3)
Chloride: 108 mmol/L (ref 98–111)
Creatinine, Ser: 0.94 mg/dL (ref 0.61–1.24)
GFR calc Af Amer: >60 mL/min (ref >60)
GFR calc non Af Amer: >60 mL/min (ref >60)
Glucose, Bld: 90 mg/dL (ref 70–99)
POTASSIUM: 3.6 mmol/L (ref 3.5–5.1)
Sodium: 139 mmol/L (ref 135–145)

## 2019-02-05 LAB — CK: CK TOTAL: 3826 U/L — AB (ref 49–397)

## 2019-02-05 MED ORDER — ACETAMINOPHEN 325 MG PO TABS: 325.0000 mg | ORAL_TABLET | Freq: Four times a day (QID) | ORAL | 0 refills | Status: AC | PRN

## 2019-02-05 NOTE — Discharge Summary (Signed)
[facility] - [place] at [facility]   PATIENT NAME: Jason Glass    MR#:  [id number]  DATE OF BIRTH:  Jan 09, 2001  DATE OF ADMISSION:  01/28/2019 ADMITTING PHYSICIAN: Altamese Dilling, MD  DATE OF DISCHARGE: No discharge date for patient encounter.  PRIMARY CARE PHYSICIAN: Patient, No Pcp Per    ADMISSION DIAGNOSIS:  Non-traumatic rhabdomyolysis [M62.82]  DISCHARGE DIAGNOSIS:  Active Problems:   Rhabdomyolysis   SECONDARY DIAGNOSIS:   Past Medical History:  Diagnosis Date  . Medical history non-contributory     HOSPITAL COURSE:  18 year old male patient with no significant past medical history currently under hospitalist service  *Acute rhabdomyolysis Resolved Secondary to aggressive weight training  Treated with IV fluids for rehydration, and patient did well Patient to avoid weight lifting/training for 1 more week, avoid NSAID use during this period of time, okay to return to school on Monday, February 08, 2019  *Acute generalized body aches  Resolved secondary to rhabdomyolysis  *Acute hypokalemia Repleted  *Acute transaminitis Resolved Most likely secondary to acute rhabdomyolysis Liver ultrasound was unimpressive, hepatitis panel negative, avoid hepatotoxic agents while in house   *Positive urine drug test for marijuana  Conservative medical management     DISCHARGE CONDITIONS:  stable  CONSULTS OBTAINED:    DRUG ALLERGIES:  No Known Allergies  DISCHARGE MEDICATIONS:   Allergies as of 02/05/2019   No Known Allergies     Medication List    STOP taking these medications   ibuprofen 400 MG tablet Commonly known as:  ADVIL     TAKE these medications   acetaminophen 325 MG tablet Commonly known as:  TYLENOL Take 1 tablet (325 mg total) by mouth every 6 (six) hours as needed for mild pain or moderate pain (headache).        DISCHARGE INSTRUCTIONS:      If you experience worsening of your  admission symptoms, develop shortness of breath, life threatening emergency, suicidal or homicidal thoughts you must seek medical attention immediately by calling 911 or calling your MD immediately  if symptoms less severe.  You Must read complete instructions/literature along with all the possible adverse reactions/side effects for all the Medicines you take and that have been prescribed to you. Take any new Medicines after you have completely understood and accept all the possible adverse reactions/side effects.   Please note  You were cared for by a hospitalist during your hospital stay. If you have any questions about your discharge medications or the care you received while you were in the hospital after you are discharged, you can call the unit and asked to speak with the hospitalist on call if the hospitalist that took care of you is not available. Once you are discharged, your primary care physician will handle any further medical issues. Please note that NO REFILLS for any discharge medications will be authorized once you are discharged, as it is imperative that you return to your primary care physician (or establish a relationship with a primary care physician if you do not have one) for your aftercare needs so that they can reassess your need for medications and monitor your lab values.    Today   CHIEF COMPLAINT:   Chief Complaint  Patient presents with  . Abnormal Lab    HISTORY OF PRESENT ILLNESS:  18 y.o. male with a known history of no medical issues, recently enrolled in weight lifting team in school and started doing high intensity exercises.  For last few days he  has been hurting excruciating in his muscles so came to emergency room.  Noted to have very high CK level and rhabdomyolysis.  Renal function is normal.  Patient denies any other complaints.    VITAL SIGNS:  Blood pressure 125/83, pulse (!) 53, temperature (!) 97.5 F (36.4 C), temperature source Oral, resp.  rate 16, height 5\' 8"  (1.727 m), weight 73 kg, SpO2 99 %.  I/O:    Intake/Output Summary (Last 24 hours) at 02/05/2019 1307 Last data filed at 02/05/2019 1610 Gross per 24 hour  Intake 2700.93 ml  Output -  Net 2700.93 ml    PHYSICAL EXAMINATION:  GENERAL:  18 y.o.-year-old patient lying in the bed with no acute distress.  EYES: Pupils equal, round, reactive to light and accommodation. No scleral icterus. Extraocular muscles intact.  HEENT: Head atraumatic, normocephalic. Oropharynx and nasopharynx clear.  NECK:  Supple, no jugular venous distention. No thyroid enlargement, no tenderness.  LUNGS: Normal breath sounds bilaterally, no wheezing, rales,rhonchi or crepitation. No use of accessory muscles of respiration.  CARDIOVASCULAR: S1, S2 normal. No murmurs, rubs, or gallops.  ABDOMEN: Soft, non-tender, non-distended. Bowel sounds present. No organomegaly or mass.  EXTREMITIES: No pedal edema, cyanosis, or clubbing.  NEUROLOGIC: Cranial nerves II through XII are intact. Muscle strength 5/5 in all extremities. Sensation intact. Gait not checked.  PSYCHIATRIC: The patient is alert and oriented x 3.  SKIN: No obvious rash, lesion, or ulcer.   DATA REVIEW:   CBC Recent Labs  Lab 01/31/19 0740  WBC 3.8*  HGB 13.1  HCT 40.1  PLT 151    Chemistries  Recent Labs  Lab 01/30/19 2220  02/02/19 0751  02/05/19 0749  NA 140   < > 138   < > 139  K 3.6   < > 4.1   < > 3.6  CL 100   < > 108   < > 108  CO2 28   < > 24   < > 25  GLUCOSE 105*   < > 85   < > 90  BUN 9   < > 12   < > 9  CREATININE 1.04   < > 0.96   < > 0.94  CALCIUM 9.8   < > 9.9   < > 9.8  MG 2.1  --   --   --   --   AST 661*   < > 377*  --   --   ALT 244*   < > 219*  --   --   ALKPHOS 39   < > 37*  --   --   BILITOT 0.6   < > 0.9  --   --    < > = values in this interval not displayed.    Cardiac Enzymes No results for input(s): TROPONINI in the last 168 hours.  Microbiology Results  No results found for this  or any previous visit.  RADIOLOGY:  No results found.  EKG:  No orders found for this or any previous visit.    Management plans discussed with the patient, family and they are in agreement.  CODE STATUS:     Code Status Orders  (From admission, onward)         Start     Ordered   01/29/19 1736  Full code  Continuous     01/29/19 1736        Code Status History    This patient has a current code status but  no historical code status.      TOTAL TIME TAKING CARE OF THIS PATIENT: 40 minutes.    Evelena AsaMontell D  M.D on 02/05/2019 at 1:07 PM  Between 7am to 6pm - Pager - [phone]  After 6pm go to [email or web address] - password [email or web address]  [facility] [place] [facility]  Office  [phone]  CC: Primary care physician; Patient, No Pcp Per   Note: This dictation was prepared with Dragon dictation along with smaller phrase technology. Any transcriptional errors that result from this process are unintentional.

## 2019-02-09 ENCOUNTER — Other Ambulatory Visit: Payer: Self-pay | Admitting: Family Medicine

## 2019-02-09 ENCOUNTER — Ambulatory Visit (INDEPENDENT_AMBULATORY_CARE_PROVIDER_SITE_OTHER): Payer: Medicaid Other | Admitting: Family Medicine

## 2019-02-09 ENCOUNTER — Encounter: Payer: Self-pay | Admitting: Family Medicine

## 2019-02-09 ENCOUNTER — Other Ambulatory Visit: Payer: Self-pay

## 2019-02-09 VITALS — BP 133/78 | HR 74 | Temp 99.0°F | Resp 16 | Ht 66.5 in | Wt 156.2 lb

## 2019-02-09 DIAGNOSIS — Z7689 Persons encountering health services in other specified circumstances: Secondary | ICD-10-CM

## 2019-02-09 DIAGNOSIS — M6282 Rhabdomyolysis: Secondary | ICD-10-CM | POA: Diagnosis not present

## 2019-02-09 DIAGNOSIS — R7989 Other specified abnormal findings of blood chemistry: Secondary | ICD-10-CM

## 2019-02-09 DIAGNOSIS — Z0000 Encounter for general adult medical examination without abnormal findings: Secondary | ICD-10-CM

## 2019-02-09 DIAGNOSIS — R945 Abnormal results of liver function studies: Secondary | ICD-10-CM

## 2019-02-09 DIAGNOSIS — Z114 Encounter for screening for human immunodeficiency virus [HIV]: Secondary | ICD-10-CM

## 2019-02-09 NOTE — Progress Notes (Signed)
Subjective:    Patient ID: Jason Glass, male    DOB: 2001/02/11, 18 y.o.   MRN: [id number]  Jason Oldslijah Giovanni is a 18 y.o. male presenting on 02/09/2019 for Establish Care (hospital follow up)  Accompanied by Roel CluckInga Hayes, patient's adoptive mother.  HPI  ESTABLISH CARE / HOSPITAL FOLLOW-UP VISIT  Hospital/Location: [facility] Date of Admission: 01/28/19 Date of Discharge: 02/05/19 Transitions of care telephone call: Not completed.  Reason for Admission: Acute Rhabdomyolysis Primary (+Secondary) Diagnosis: Acute Rhabdomyolysis, dehydration, myalgia, hypokalemia, elevated liver enzymes  FOLLOW-UP Rhabdomyolysis - Hospital H&P and Discharge Summary have been reviewed - Patient presents today about 4 days after recent hospitalization. Brief summary of recent course, patient had symptoms of some dehydration poor PO intake and some nausea and aggressive weight training 2.5 weeks daily, advanced weight lifting class and football after had period of time off weight lifting, and developed significant muscle pain in bilateral arms and chest, unable to extend arms fully - he was seen at [facility], had abnormal urine Hgb w/ few RBC, CK showed 34k, normal kidney function, [facility]. In ED had labs showed elevated LFT, normal Cr, with peak CK around 48k, serial monitoring with decreased CK eventually down to 3.8k at time of discharge 2/7. LFT repeat improved but not re-checked to normal values prior to leaving, he was treated with IVF rehydration and PO water intake.  - Today reports overall has done well after discharge. Symptoms of arm pain have significantly improved, he still takes Tylenol 325mg  1-2 per dose every 6 hr PRN with some good results. He is not taking Ibuprofen. He is much improved appetite and water intake, trying to stay well hydrated. Most improvement is from rest, avoiding weight lifting. He has been out since discharge from hospital, has note to return on 2/17, request a  letter confirming this to resume weight lifting 2/17.  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 Medical history non-contributory   . Rhabdomyolysis    Past Surgical History:  Procedure Laterality Date  . NO PAST SURGERIES     Social History   Socioeconomic History  . Marital status: Single    Spouse name: Not on file  . Number of children: Not on file  . Years of education: Not on file  . Highest education level: Not on file  Occupational History  . Not on file  Social Needs  . Financial resource strain: Not on file  . Food insecurity:    Worry: Not on file    Inability: Not on file  . Transportation needs:    Medical: Not on file    Non-medical: Not on file  Tobacco Use  . Smoking status: Never Smoker  . Smokeless tobacco: Never Used  Substance and Sexual Activity  . Alcohol use: No  . Drug use: Never  . Sexual activity: Not on file  Lifestyle  . Physical activity:    Days per week: Not on file    Minutes per session: Not on file  .  Stress: Not on file  Relationships  . Social connections:    Talks on phone: Not on file    Gets together: Not on file    Attends religious service: Not on file    Active member of club or organization: Not on file    Attends meetings of clubs or organizations: Not on file    Relationship status: Not on file  . Intimate partner violence:    Fear of current or ex partner: Not on file    Emotionally abused: Not on file    Physically abused: Not on file    Forced  sexual activity: Not on file  Other Topics Concern  . Not on file  Social History Narrative  . Not on file   Family History  Problem Relation Age of Onset  . Healthy Mother   . Healthy Father   . Other Paternal Grandfather        hypoglycemia  . Colon cancer Neg Hx    Current Outpatient Medications on File Prior to Visit  Medication Sig  . acetaminophen (TYLENOL) 325 MG tablet Take 1 tablet (325 mg total) by mouth every 6 (six) hours as needed for mild pain or moderate pain (headache). (Patient not taking: Reported on 02/09/2019)   No current facility-administered medications on file prior to visit.     Review of Systems Per HPI unless specifically indicated above      Objective:    BP 133/78   Pulse 74   Temp 99 F (37.2 C) (Oral)   Resp 16   Ht 5' 6.5" (1.689 m)   Wt 156 lb 3.2 oz (70.9 kg)   SpO2 100%   BMI 24.83 kg/m   Wt Readings from Last 3 Encounters:  02/09/19 156 lb 3.2 oz (70.9 kg) (61 %, Z= 0.29)*  01/29/19 160 lb 14.4 oz (73 kg) (68 %, Z= 0.47)*  01/28/19 163 lb (73.9 kg) (71 %, Z= 0.54)*   * Growth percentiles are based on CDC (Boys, 2-20 Years) data.    Physical Exam Vitals signs and nursing note reviewed.  Constitutional:      General: He is not in acute distress.    Appearance: He is well-developed. He is not diaphoretic.     Comments: Well-appearing, comfortable, cooperative, lean muscular build  HENT:     Head: Normocephalic and atraumatic.  Eyes:     General:        Right eye: No discharge.        Left eye: No discharge.     Conjunctiva/sclera: Conjunctivae normal.  Cardiovascular:     Rate and Rhythm: Normal rate.  Pulmonary:     Effort: Pulmonary effort is normal.  Musculoskeletal:     Comments: Forearms without swelling, erythema ecchymosis, deformity, non tender, has full ROM.  Skin:    General: Skin is warm and dry.     Findings: No erythema or rash.  Neurological:     Mental Status: He is alert and oriented to person, place,  and time.  Psychiatric:        Behavior: Behavior normal.     Comments: Well groomed, good eye contact, normal speech and thoughts    Results for orders placed or performed during the hospital encounter of 01/28/19  CK  Result Value Ref Range   Total CK 35,402 (H) 49 - 397 U/L  Basic metabolic panel  Result Value Ref Range   Sodium 139 135 - 145 mmol/L   Potassium 3.6 3.5 -  5.1 mmol/L   Chloride 109 98 - 111 mmol/L   CO2 26 22 - 32 mmol/L   Glucose, Bld 93 70 - 99 mg/dL   BUN 9 6 - 20 mg/dL   Creatinine, Ser 6.04 0.61 - 1.24 mg/dL   Calcium 8.9 8.9 - 54.0 mg/dL   GFR calc non Af Amer >60 >60 mL/min   GFR calc Af Amer >60 >60 mL/min   Anion gap 4 (L) 5 - 15  CK  Result Value Ref Range   Total CK 40,972 (H) 49 - 397 U/L  CK  Result Value Ref Range   Total CK 48,252 (H) 49 - 397 U/L  CK  Result Value Ref Range   Total CK 47,166 (H) 49 - 397 U/L  CK  Result Value Ref Range   Total CK 45,951 (H) 49 - 397 U/L  CK  Result Value Ref Range   Total CK 42,970 (H) 49 - 397 U/L  Basic metabolic panel  Result Value Ref Range   Sodium 139 135 - 145 mmol/L   Potassium 3.3 (L) 3.5 - 5.1 mmol/L   Chloride 99 98 - 111 mmol/L   CO2 35 (H) 22 - 32 mmol/L   Glucose, Bld 113 (H) 70 - 99 mg/dL   BUN 7 6 - 20 mg/dL   Creatinine, Ser 9.81 0.61 - 1.24 mg/dL   Calcium 9.3 8.9 - 19.1 mg/dL   GFR calc non Af Amer >60 >60 mL/min   GFR calc Af Amer >60 >60 mL/min   Anion gap 5 5 - 15  CBC  Result Value Ref Range   WBC 4.4 4.0 - 10.5 K/uL   RBC 4.20 (L) 4.22 - 5.81 MIL/uL   Hemoglobin 12.5 (L) 13.0 - 17.0 g/dL   HCT 47.8 (L) 29.5 - 62.1 %   MCV 89.5 80.0 - 100.0 fL   MCH 29.8 26.0 - 34.0 pg   MCHC 33.2 30.0 - 36.0 g/dL   RDW 30.8 65.7 - 84.6 %   Platelets 146 (L) 150 - 400 K/uL   nRBC 0.0 0.0 - 0.2 %  CK  Result Value Ref Range   Total CK >50,000 (H) 49 - 397 U/L  CK  Result Value Ref Range   Total CK 47,143 (H) 49 - 397 U/L  Comprehensive metabolic panel  Result Value Ref Range     Sodium 140 135 - 145 mmol/L   Potassium 4.3 3.5 - 5.1 mmol/L   Chloride 104 98 - 111 mmol/L   CO2 31 22 - 32 mmol/L   Glucose, Bld 95 70 - 99 mg/dL   BUN 8 6 - 20 mg/dL   Creatinine, Ser 9.62 0.61 - 1.24 mg/dL   Calcium 9.7 8.9 - 95.2 mg/dL   Total Protein 7.1 6.5 - 8.1 g/dL   Albumin 4.0 3.5 - 5.0 g/dL   AST 841 (H) 15 - 41 U/L   ALT 230 (H) 0 - 44 U/L   Alkaline Phosphatase 36 (L) 38 - 126 U/L   Total Bilirubin 0.9 0.3 - 1.2 mg/dL   GFR calc non Af Amer >60 >60 mL/min   GFR calc Af Amer >60 >60 mL/min   Anion gap 5 5 - 15  CBC  Result Value Ref Range   WBC 3.8 (L) 4.0 - 10.5 K/uL   RBC 4.48 4.22 - 5.81 MIL/uL   Hemoglobin 13.1 13.0 - 17.0 g/dL   HCT 32.4 40.1 - 02.7 %   MCV 89.5 80.0 - 100.0  fL   MCH 29.2 26.0 - 34.0 pg   MCHC 32.7 30.0 - 36.0 g/dL   RDW 09.8 11.9 - 14.7 %   Platelets 151 150 - 400 K/uL   nRBC 0.0 0.0 - 0.2 %  CK  Result Value Ref Range   Total CK 46,549 (H) 49 - 397 U/L  Urinalysis, Complete w Microscopic  Result Value Ref Range   Color, Urine COLORLESS (A) YELLOW   APPearance CLEAR (A) CLEAR   Specific Gravity, Urine 1.004 (L) 1.005 - 1.030   pH 9.0 (H) 5.0 - 8.0   Glucose, UA NEGATIVE NEGATIVE mg/dL   Hgb urine dipstick NEGATIVE NEGATIVE   Bilirubin Urine NEGATIVE NEGATIVE   Ketones, ur NEGATIVE NEGATIVE mg/dL   Protein, ur NEGATIVE NEGATIVE mg/dL   Nitrite NEGATIVE NEGATIVE   Leukocytes, UA NEGATIVE NEGATIVE   RBC / HPF 0-5 0 - 5 RBC/hpf   WBC, UA NONE SEEN 0 - 5 WBC/hpf   Bacteria, UA RARE (A) NONE SEEN   Squamous Epithelial / LPF NONE SEEN 0 - 5  Urine Drug Screen, Qualitative ([facility] only)  Result Value Ref Range   Tricyclic, Ur Screen NONE DETECTED NONE DETECTED   Amphetamines, Ur Screen NONE DETECTED NONE DETECTED   MDMA (Ecstasy)Ur Screen NONE DETECTED NONE DETECTED   Cocaine Metabolite,Ur [place] NONE DETECTED NONE DETECTED   Opiate, Ur Screen NONE DETECTED NONE DETECTED   Phencyclidine (PCP) Ur S NONE DETECTED NONE DETECTED    Cannabinoid 50 Ng, Ur  POSITIVE (A) NONE DETECTED   Barbiturates, Ur Screen NONE DETECTED NONE DETECTED   Benzodiazepine, Ur Scrn NONE DETECTED NONE DETECTED   Methadone Scn, Ur NONE DETECTED NONE DETECTED  Magnesium  Result Value Ref Range   Magnesium 2.1 1.7 - 2.4 mg/dL  Phosphorus  Result Value Ref Range   Phosphorus 4.1 2.5 - 4.6 mg/dL  Protime-INR  Result Value Ref Range   Prothrombin Time 12.7 11.4 - 15.2 seconds   INR 0.96   CK  Result Value Ref Range   Total CK >50,000 (H) 49 - 397 U/L  CK  Result Value Ref Range   Total CK 42,046 (H) 49 - 397 U/L  Blood gas, arterial  Result Value Ref Range   FIO2 0.21    pH, Arterial 7.50 (H) 7.350 - 7.450   pCO2 arterial 49 (H) 32.0 - 48.0 mmHg   pO2, Arterial 79 (L) 83.0 - 108.0 mmHg   Bicarbonate 38.2 (H) 20.0 - 28.0 mmol/L   Acid-Base Excess 13.0 (H) 0.0 - 2.0 mmol/L   O2 Saturation 96.6 %   Patient temperature 37.0    Collection site RIGHT RADIAL    Sample type ARTERIAL DRAW    Allens test (pass/fail) PASS PASS  Comprehensive metabolic panel  Result Value Ref Range   Sodium 140 135 - 145 mmol/L   Potassium 3.6 3.5 - 5.1 mmol/L   Chloride 100 98 - 111 mmol/L   CO2 28 22 - 32 mmol/L   Glucose, Bld 105 (H) 70 - 99 mg/dL   BUN 9 6 - 20 mg/dL   Creatinine, Ser 8.29 0.61 - 1.24 mg/dL   Calcium 9.8 8.9 - 56.2 mg/dL   Total Protein 7.2 6.5 - 8.1 g/dL   Albumin 4.2 3.5 - 5.0 g/dL   AST 130 (H) 15 - 41 U/L   ALT 244 (H) 0 - 44 U/L   Alkaline Phosphatase 39 38 - 126 U/L   Total Bilirubin 0.6 0.3 - 1.2 mg/dL  GFR calc non Af Amer >60 >60 mL/min   GFR calc Af Amer >60 >60 mL/min   Anion gap 12 5 - 15  Hepatitis panel, acute  Result Value Ref Range   Hepatitis B Surface Ag Negative Negative   HCV Ab <0.1 0.0 - 0.9 s/co ratio   Hep A IgM Negative Negative   Hep B C IgM Negative Negative  CK  Result Value Ref Range   Total CK 33,624 (H) 49 - 397 U/L  CK  Result Value Ref Range   Total CK 30,927 (H) 49 - 397 U/L    Comprehensive metabolic panel  Result Value Ref Range   Sodium 138 135 - 145 mmol/L   Potassium 4.1 3.5 - 5.1 mmol/L   Chloride 108 98 - 111 mmol/L   CO2 24 22 - 32 mmol/L   Glucose, Bld 85 70 - 99 mg/dL   BUN 12 6 - 20 mg/dL   Creatinine, Ser 2.95 0.61 - 1.24 mg/dL   Calcium 9.9 8.9 - 62.1 mg/dL   Total Protein 7.3 6.5 - 8.1 g/dL   Albumin 4.0 3.5 - 5.0 g/dL   AST 308 (H) 15 - 41 U/L   ALT 219 (H) 0 - 44 U/L   Alkaline Phosphatase 37 (L) 38 - 126 U/L   Total Bilirubin 0.9 0.3 - 1.2 mg/dL   GFR calc non Af Amer >60 >60 mL/min   GFR calc Af Amer >60 >60 mL/min   Anion gap 6 5 - 15  CK  Result Value Ref Range   Total CK 21,412 (H) 49 - 397 U/L  CK  Result Value Ref Range   Total CK 15,425 (H) 49 - 397 U/L  Basic metabolic panel  Result Value Ref Range   Sodium 137 135 - 145 mmol/L   Potassium 3.7 3.5 - 5.1 mmol/L   Chloride 108 98 - 111 mmol/L   CO2 26 22 - 32 mmol/L   Glucose, Bld 87 70 - 99 mg/dL   BUN 10 6 - 20 mg/dL   Creatinine, Ser 6.57 0.61 - 1.24 mg/dL   Calcium 9.5 8.9 - 84.6 mg/dL   GFR calc non Af Amer >60 >60 mL/min   GFR calc Af Amer >60 >60 mL/min   Anion gap 3 (L) 5 - 15  CK  Result Value Ref Range   Total CK 10,232 (H) 49 - 397 U/L  CK  Result Value Ref Range   Total CK 7,441 (H) 49 - 397 U/L  Basic metabolic panel  Result Value Ref Range   Sodium 139 135 - 145 mmol/L   Potassium 3.5 3.5 - 5.1 mmol/L   Chloride 108 98 - 111 mmol/L   CO2 26 22 - 32 mmol/L   Glucose, Bld 90 70 - 99 mg/dL   BUN 10 6 - 20 mg/dL   Creatinine, Ser 9.62 0.61 - 1.24 mg/dL   Calcium 9.6 8.9 - 95.2 mg/dL   GFR calc non Af Amer >60 >60 mL/min   GFR calc Af Amer >60 >60 mL/min   Anion gap 5 5 - 15  CK  Result Value Ref Range   Total CK 6,440 (H) 49 - 397 U/L  CK  Result Value Ref Range   Total CK 5,296 (H) 49 - 397 U/L  Basic metabolic panel  Result Value Ref Range   Sodium 139 135 - 145 mmol/L   Potassium 3.6 3.5 - 5.1 mmol/L   Chloride 108 98 - 111 mmol/L  CO2 25 22 - 32 mmol/L   Glucose, Bld 90 70 - 99 mg/dL   BUN 9 6 - 20 mg/dL   Creatinine, Ser 1.61 0.61 - 1.24 mg/dL   Calcium 9.8 8.9 - 09.6 mg/dL   GFR calc non Af Amer >60 >60 mL/min   GFR calc Af Amer >60 >60 mL/min   Anion gap 6 5 - 15  CK  Result Value Ref Range   Total CK 3,826 (H) 49 - 397 U/L      Assessment & Plan:   Problem List Items Addressed This Visit    RESOLVED: Rhabdomyolysis - Primary    RESOLVED now with IV rehydration, PO hydration, and rest - Site: Forearms bilateral, due to excessive weight training and dehydration - No complicated kidney failure, has had stable Cr - Complicated elevated LFT transaminitis, since improved  Plan - Reassurance, recommend remain out of weight lifting until may resume on Mon 2/17 - letter written, discussed emphasis on very gradual increasing weight now when he returns, listen to body if significant pain or sore that day or following day may need additional day of rest between sessions, improve hydration PO to help prevent future issue  Will re-check labs as planned later this year, anticipate 3 months for panel including CMET for LFT  Follow-up as need, advised return precautions       Other Visit Diagnoses    Encounter to establish care with new doctor        No prior PCP records for review.   No orders of the defined types were placed in this encounter.   Follow up plan: Return in about 3 months (around 05/10/2019) for Annual Physical.  Future labs ordered for 05/20/19  Saralyn Pilar, DO [facility] 02/09/2019, 3:23 PM

## 2019-02-09 NOTE — Assessment & Plan Note (Addendum)
RESOLVED now with IV rehydration, PO hydration, and rest - Site: Forearms bilateral, due to excessive weight training and dehydration - No complicated kidney failure, has had stable Cr - Complicated elevated LFT transaminitis, since improved  Plan - Reassurance, recommend remain out of weight lifting until may resume on Mon 2/17 - letter written, discussed emphasis on very gradual increasing weight now when he returns, listen to body if significant pain or sore that day or following day may need additional day of rest between sessions, improve hydration PO to help prevent future issue  Will re-check labs as planned later this year, anticipate 3 months for panel including CMET for LFT  Follow-up as need, advised return precautions

## 2019-02-09 NOTE — Patient Instructions (Addendum)
Thank you for coming to the office today.  Keep improving hydration overall as we have discussed.  May restart weight training next week on Monday 2/17 - gradual increase, caution if sore or significant pain then may need to take a break for 1-2 days or more as needed.   DUE for FASTING BLOOD WORK (no food or drink after midnight before the lab appointment, only water or coffee without cream/sugar on the morning of)  SCHEDULE "Lab Only" visit in the morning at the clinic for lab draw in 3 MONTHS   - Make sure Lab Only appointment is at about 1 week before your next appointment, so that results will be available  For Lab Results, once available within 2-3 days of blood draw, you can can log in to MyChart online to view your results and a brief explanation. Also, we can discuss results at next follow-up visit.  Please schedule a Follow-up Appointment to: Return in about 3 months (around 05/10/2019) for Annual Physical.  If you have any other questions or concerns, please feel free to call the office or send a message through MyChart. You may also schedule an earlier appointment if necessary.  Additionally, you may be receiving a survey about your experience at our office within a few days to 1 week by e-mail or mail. We value your feedback.  Saralyn Pilar, DO [facility], [place]

## 2019-03-31 ENCOUNTER — Other Ambulatory Visit: Payer: Self-pay

## 2019-03-31 ENCOUNTER — Encounter: Payer: Self-pay | Admitting: Family Medicine

## 2019-03-31 ENCOUNTER — Ambulatory Visit (INDEPENDENT_AMBULATORY_CARE_PROVIDER_SITE_OTHER): Payer: Medicaid Other | Admitting: Family Medicine

## 2019-03-31 DIAGNOSIS — I808 Phlebitis and thrombophlebitis of other sites: Secondary | ICD-10-CM

## 2019-03-31 DIAGNOSIS — R229 Localized swelling, mass and lump, unspecified: Secondary | ICD-10-CM | POA: Diagnosis not present

## 2019-03-31 NOTE — Patient Instructions (Signed)
AVS follow-up information provided by phone, no access to mychart, see note.

## 2019-03-31 NOTE — Progress Notes (Signed)
Virtual Visit via Telephone The purpose of this virtual visit is to provide medical care while limiting exposure to the novel coronavirus (COVID19) for both patient and office staff.  Consent was obtained for phone visit:  Yes.   Answered questions that patient had about telehealth interaction:  Yes.   I discussed the limitations, risks, security and privacy concerns of performing an evaluation and management service by telephone. I also discussed with the patient that there may be a patient responsible charge related to this service. The patient expressed understanding and agreed to proceed.  Patient Location: Home Provider Location: [facility])  ---------------------------------------------------------------------- Chief Complaint  Patient presents with  . Knot on arm    pt notice a knot on his left arm over a vein that appeared a week after a blood draw.     S: Reviewed CMA telephone note below. I have called patient and gathered additional HPI as follows:  LEFT Arm / Superficial Nodule vs Knot over vein / Possible Superficial Phlebitis Reports that symptoms started about 1 week later after leaving hospital, he was admitted on 01/28/19 for acute rhabdomyolysis and dehydration, he was treated with IVF fluid and blood draw multiple times, he was discharged on 02/05/19 - describes location as inner aspect of Left arm antecubital area from area of vein that had blood draw - he has concerns with feeling "tight" when he moves his arm or flexing, he does not feel it at rest - he does feel smaller than pea-sized "knot" that is somewhat firm and mobile, that is non tender when he touches it - He takes Ibuprofen PRN with some relief, not tried warm compresses - He continues to do body exercises, push ups, crunches, running outdoors, no longer lifting heavy weights - Admits some subjective swelling of left upper arm he can feel it more than he can see it - Denies bruising,  redness, arm pain, ulceration, rash, immobilization  Denies any fevers, chills, sweats, body ache, cough, shortness of breath, sinus pain or pressure, headache, abdominal pain, diarrhea, spreading redness, swelling  Past Medical History:  Diagnosis Date  . Medical history non-contributory   . Rhabdomyolysis    Social History   Tobacco Use  . Smoking status: Never Smoker  . Smokeless tobacco: Never Used  Substance Use Topics  . Alcohol use: No  . Drug use: Never    Current Outpatient Medications:  .  acetaminophen (TYLENOL) 325 MG tablet, Take 1 tablet (325 mg total) by mouth every 6 (six) hours as needed for mild pain or moderate pain (headache)., Disp: 30 tablet, Rfl: 0  Depression screen [facility] 2/9 02/09/2019  Decreased Interest 0  Down, Depressed, Hopeless 0  PHQ - 2 Score 0    No flowsheet data found.  -------------------------------------------------------------------------- O: No physical exam performed due to remote telephone encounter.    -------------------------------------------------------------------------- A&P:  Problem List Items Addressed This Visit    None    Visit Diagnoses    Skin nodule    -  Primary   Superficial phlebitis of arm         Clinically with uncertain exact diagnosis, difficult to determine remotely Based on history and description sounds most consistent with superficial phlebitis vs thrombophlebitis vs nodular density of superficial vein - due to multiple IV infiltration at hospitalization recently - Also consider hematoma possibility - Onset symptoms approx 1-2 week after discharged in February 2020, note this was not present on exam done at our office on 02/09/19 -  No evidence or sign of acute DVT . Well's score 0. No immobilization - Remains active, and no other significant symptoms in that arm  Plan - Reassurance provided, again most likely self limited, may have lingering symptoms from the venipuncture trauma as mentioned  above - Less likely blood clot and if anything would be superficial only - unlikely DVT - START warm compresses regularly as needed - Can try sleeve for compression - May take Ibuprofen NSAID PRN discomfort or swelling - Stay active - Monitor symptoms - Return criteria reviewed, if worsening acute swelling, pain, redness or other concerns notify us or seek care  Advised that we could consider future Venous Doppler Upper Ext to eval this further diagnostically - if not improved or resolved within next 2-6 weeks - he will call back, advised that due to coronavirus this would not be considered high risk acute case and would prefer to defer scheduling this test right now, he agrees that symptoms are not severe and was reassured.  No orders of the defined types were placed in this encounter.   Follow-up: - Return to care / contact us within 2-4 weeks as needed if not improved  Patient verbalizes understanding with the above medical recommendations including the limitation of remote medical advice.  Specific follow-up and call-back criteria were given for patient to follow-up or seek medical care more urgently if needed.  - Time spent in direct consultation with patient on phone: 13 minutes  Saralyn Pilar, DO [facility] 03/31/2019, 3:00 PM

## 2019-05-20 ENCOUNTER — Other Ambulatory Visit: Payer: Medicaid Other

## 2019-05-26 ENCOUNTER — Encounter: Payer: Medicaid Other | Admitting: Family Medicine

## 2019-10-05 ENCOUNTER — Ambulatory Visit (INDEPENDENT_AMBULATORY_CARE_PROVIDER_SITE_OTHER): Payer: Medicaid Other

## 2019-10-05 ENCOUNTER — Other Ambulatory Visit: Payer: Self-pay

## 2019-10-05 DIAGNOSIS — Z23 Encounter for immunization: Secondary | ICD-10-CM

## 2019-10-12 ENCOUNTER — Telehealth: Payer: Self-pay

## 2019-10-12 NOTE — Telephone Encounter (Signed)
Open to document immunization.

## 2019-10-20 ENCOUNTER — Ambulatory Visit (INDEPENDENT_AMBULATORY_CARE_PROVIDER_SITE_OTHER): Payer: Medicaid Other | Admitting: Family Medicine

## 2019-10-20 ENCOUNTER — Ambulatory Visit: Payer: Medicaid Other

## 2019-10-20 ENCOUNTER — Encounter: Payer: Self-pay | Admitting: Family Medicine

## 2019-10-20 ENCOUNTER — Other Ambulatory Visit: Payer: Self-pay

## 2019-10-20 VITALS — BP 132/72 | HR 60 | Temp 98.8°F | Resp 16 | Ht 69.0 in | Wt 148.6 lb

## 2019-10-20 DIAGNOSIS — Z025 Encounter for examination for participation in sport: Secondary | ICD-10-CM | POA: Diagnosis not present

## 2019-10-20 NOTE — Progress Notes (Signed)
Subjective:    Patient ID: Jason Glass, male    DOB: November 02, 2001, 18 y.o.   MRN: [id number]  Jason Glass is a 18 y.o. male presenting on 10/20/2019 for Annual Exam   HPI   SPORTS PHYSICAL  - Patient is currently in the 12th grade at [employer], and plans to participate on the  [employer] team for school, practice has started. They have requested Sports Physical and document completed prior to competition in February 2021 - All standard sports physical questions completed per provided handout, only positive question answered yes was problem with prior hospitalization medical problem #21-22 - hospitalized in 12/2018 - 01/2019 due to acute rhabdomyolysis injury to muscles and resolved with rehydration IV and oral rehydration has resolved without any complication. No known prior history of concussion, head trauma, significant known joint problem or fracture, surgery, chest pain, dyspnea, exercise induced asthma, syncope related to exertion, family history of sudden cardiac death, seizures, among other questions (all negative)  Health Maintenance: Vaccines - reviewed [facility] immunization record. All vaccines due and recommended were discussed with both patient and his father today.  UTD Meningococcal ACY 10/05/19   He is due for the following vaccines at this time:  Due Flu Vaccine will return soon for this  HPV: He has not received any of the HPV series vaccines. Discussed risks of HPV possible inc risk of types of cancer with high risk types and reducing risk of cervical cancer for females. He would be due for series of 3 vaccines. Due for 1st at next visit when return, next interval is 2 months then 6 months.   Depression screen [facility] 2/9 10/20/2019 02/09/2019  Decreased Interest 0 0  Down, Depressed, Hopeless 0 0  PHQ - 2 Score 0 0    Past Medical History:  Diagnosis Date  . Medical history non-contributory   . Rhabdomyolysis    Past Surgical History:  Procedure  Laterality Date  . NO PAST SURGERIES     Social History   Socioeconomic History  . Marital status: Single    Spouse name: Not on file  . Number of children: Not on file  . Years of education: Not on file  . Highest education level: Not on file  Occupational History  . Not on file  Social Needs  . Financial resource strain: Not on file  . Food insecurity    Worry: Not on file    Inability: Not on file  . Transportation needs    Medical: Not on file    Non-medical: Not on file  Tobacco Use  . Smoking status: Never Smoker  . Smokeless tobacco: Never Used  Substance and Sexual Activity  . Alcohol use: No  . Drug use: Never  . Sexual activity: Not on file  Lifestyle  . Physical activity    Days per week: Not on file    Minutes per session: Not on file  . Stress: Not on file  Relationships  . Social Musician on phone: Not on file    Gets together: Not on file    Attends religious service: Not on file    Active member of club or organization: Not on file    Attends meetings of clubs or organizations: Not on file    Relationship status: Not on file  . Intimate partner violence    Fear of current or ex partner: Not on file    Emotionally abused: Not on file  Physically abused: Not on file    Forced sexual activity: Not on file  Other Topics Concern  . Not on file  Social History Narrative  . Not on file   Family History  Problem Relation Age of Onset  . Healthy Mother   . Healthy Father   . Other Paternal Grandfather        hypoglycemia  . Colon cancer Neg Hx    Current Outpatient Medications on File Prior to Visit  Medication Sig  . acetaminophen (TYLENOL) 325 MG tablet Take 1 tablet (325 mg total) by mouth every 6 (six) hours as needed for mild pain or moderate pain (headache).   No current facility-administered medications on file prior to visit.     Review of Systems  Constitutional: Negative for activity change, appetite change, chills,  diaphoresis, fatigue and fever.  HENT: Negative for congestion and hearing loss.   Eyes: Negative for visual disturbance.  Respiratory: Negative for apnea, cough, chest tightness, shortness of breath and wheezing.   Cardiovascular: Negative for chest pain, palpitations and leg swelling.  Gastrointestinal: Negative for abdominal pain, anal bleeding, blood in stool, constipation, diarrhea, nausea and vomiting.  Endocrine: Negative for cold intolerance.  Genitourinary: Negative for decreased urine volume, difficulty urinating, dysuria, frequency, hematuria, testicular pain and urgency.  Musculoskeletal: Negative for arthralgias, back pain and neck pain.  Skin: Negative for rash.  Allergic/Immunologic: Negative for environmental allergies.  Neurological: Negative for dizziness, weakness, light-headedness, numbness and headaches.  Hematological: Negative for adenopathy.  Psychiatric/Behavioral: Negative for behavioral problems, dysphoric mood and sleep disturbance. The patient is not nervous/anxious.    Per HPI unless specifically indicated above      Objective:    BP 132/72   Pulse 60   Temp 98.8 F (37.1 C) (Oral)   Resp 16   Ht 5\' 9"  (1.753 m)   Wt 148 lb 9.6 oz (67.4 kg)   BMI 21.94 kg/m   Wt Readings from Last 3 Encounters:  10/20/19 148 lb 9.6 oz (67.4 kg) (45 %, Z= -0.13)*  02/09/19 156 lb 3.2 oz (70.9 kg) (61 %, Z= 0.29)*  01/29/19 160 lb 14.4 oz (73 kg) (68 %, Z= 0.47)*   * Growth percentiles are based on CDC (Boys, 2-20 Years) data.    Physical Exam Vitals signs and nursing note reviewed.  Constitutional:      General: He is not in acute distress.    Appearance: He is well-developed. He is not diaphoretic.     Comments: Well-appearing, comfortable, cooperative  HENT:     Head: Normocephalic and atraumatic.  Eyes:     General:        Right eye: No discharge.        Left eye: No discharge.     Conjunctiva/sclera: Conjunctivae normal.     Pupils: Pupils are equal,  round, and reactive to light.  Neck:     Musculoskeletal: Normal range of motion and neck supple.     Thyroid: No thyromegaly.  Cardiovascular:     Rate and Rhythm: Normal rate and regular rhythm.     Heart sounds: Normal heart sounds. No murmur.  Pulmonary:     Effort: Pulmonary effort is normal. No respiratory distress.     Breath sounds: Normal breath sounds. No wheezing or rales.  Abdominal:     General: Bowel sounds are normal. There is no distension.     Palpations: Abdomen is soft. There is no mass.     Tenderness: There is no  abdominal tenderness.  Musculoskeletal: Normal range of motion.        General: No tenderness.     Comments: Upper / Lower Extremities: - Normal muscle tone, strength bilateral upper extremities 5/5, lower extremities 5/5  Lymphadenopathy:     Cervical: No cervical adenopathy.  Skin:    General: Skin is warm and dry.     Findings: No erythema or rash.  Neurological:     Mental Status: He is alert and oriented to person, place, and time.     Comments: Distal sensation intact to light touch all extremities  Psychiatric:        Behavior: Behavior normal.     Comments: Well groomed, good eye contact, normal speech and thoughts    Results for orders placed or performed during the hospital encounter of 01/28/19  CK  Result Value Ref Range   Total CK 35,402 (H) 49 - 397 U/L  Basic metabolic panel  Result Value Ref Range   Sodium 139 135 - 145 mmol/L   Potassium 3.6 3.5 - 5.1 mmol/L   Chloride 109 98 - 111 mmol/L   CO2 26 22 - 32 mmol/L   Glucose, Bld 93 70 - 99 mg/dL   BUN 9 6 - 20 mg/dL   Creatinine, Ser 1.61 0.61 - 1.24 mg/dL   Calcium 8.9 8.9 - 09.6 mg/dL   GFR calc non Af Amer >60 >60 mL/min   GFR calc Af Amer >60 >60 mL/min   Anion gap 4 (L) 5 - 15  CK  Result Value Ref Range   Total CK 40,972 (H) 49 - 397 U/L  CK  Result Value Ref Range   Total CK 48,252 (H) 49 - 397 U/L  CK  Result Value Ref Range   Total CK 47,166 (H) 49 - 397 U/L   CK  Result Value Ref Range   Total CK 45,951 (H) 49 - 397 U/L  CK  Result Value Ref Range   Total CK 42,970 (H) 49 - 397 U/L  Basic metabolic panel  Result Value Ref Range   Sodium 139 135 - 145 mmol/L   Potassium 3.3 (L) 3.5 - 5.1 mmol/L   Chloride 99 98 - 111 mmol/L   CO2 35 (H) 22 - 32 mmol/L   Glucose, Bld 113 (H) 70 - 99 mg/dL   BUN 7 6 - 20 mg/dL   Creatinine, Ser 0.45 0.61 - 1.24 mg/dL   Calcium 9.3 8.9 - 40.9 mg/dL   GFR calc non Af Amer >60 >60 mL/min   GFR calc Af Amer >60 >60 mL/min   Anion gap 5 5 - 15  CBC  Result Value Ref Range   WBC 4.4 4.0 - 10.5 K/uL   RBC 4.20 (L) 4.22 - 5.81 MIL/uL   Hemoglobin 12.5 (L) 13.0 - 17.0 g/dL   HCT 81.1 (L) 91.4 - 78.2 %   MCV 89.5 80.0 - 100.0 fL   MCH 29.8 26.0 - 34.0 pg   MCHC 33.2 30.0 - 36.0 g/dL   RDW 95.6 21.3 - 08.6 %   Platelets 146 (L) 150 - 400 K/uL   nRBC 0.0 0.0 - 0.2 %  CK  Result Value Ref Range   Total CK >50,000 (H) 49 - 397 U/L  CK  Result Value Ref Range   Total CK 47,143 (H) 49 - 397 U/L  Comprehensive metabolic panel  Result Value Ref Range   Sodium 140 135 - 145 mmol/L   Potassium 4.3 3.5 -  5.1 mmol/L   Chloride 104 98 - 111 mmol/L   CO2 31 22 - 32 mmol/L   Glucose, Bld 95 70 - 99 mg/dL   BUN 8 6 - 20 mg/dL   Creatinine, Ser 6.040.96 0.61 - 1.24 mg/dL   Calcium 9.7 8.9 - 54.010.3 mg/dL   Total Protein 7.1 6.5 - 8.1 g/dL   Albumin 4.0 3.5 - 5.0 g/dL   AST 981606 (H) 15 - 41 U/L   ALT 230 (H) 0 - 44 U/L   Alkaline Phosphatase 36 (L) 38 - 126 U/L   Total Bilirubin 0.9 0.3 - 1.2 mg/dL   GFR calc non Af Amer >60 >60 mL/min   GFR calc Af Amer >60 >60 mL/min   Anion gap 5 5 - 15  CBC  Result Value Ref Range   WBC 3.8 (L) 4.0 - 10.5 K/uL   RBC 4.48 4.22 - 5.81 MIL/uL   Hemoglobin 13.1 13.0 - 17.0 g/dL   HCT 19.140.1 47.839.0 - 29.552.0 %   MCV 89.5 80.0 - 100.0 fL   MCH 29.2 26.0 - 34.0 pg   MCHC 32.7 30.0 - 36.0 g/dL   RDW 62.112.2 30.811.5 - 65.715.5 %   Platelets 151 150 - 400 K/uL   nRBC 0.0 0.0 - 0.2 %  CK  Result  Value Ref Range   Total CK 46,549 (H) 49 - 397 U/L  Urinalysis, Complete w Microscopic  Result Value Ref Range   Color, Urine COLORLESS (A) YELLOW   APPearance CLEAR (A) CLEAR   Specific Gravity, Urine 1.004 (L) 1.005 - 1.030   pH 9.0 (H) 5.0 - 8.0   Glucose, UA NEGATIVE NEGATIVE mg/dL   Hgb urine dipstick NEGATIVE NEGATIVE   Bilirubin Urine NEGATIVE NEGATIVE   Ketones, ur NEGATIVE NEGATIVE mg/dL   Protein, ur NEGATIVE NEGATIVE mg/dL   Nitrite NEGATIVE NEGATIVE   Leukocytes, UA NEGATIVE NEGATIVE   RBC / HPF 0-5 0 - 5 RBC/hpf   WBC, UA NONE SEEN 0 - 5 WBC/hpf   Bacteria, UA RARE (A) NONE SEEN   Squamous Epithelial / LPF NONE SEEN 0 - 5  Urine Drug Screen, Qualitative ([facility] only)  Result Value Ref Range   Tricyclic, Ur Screen NONE DETECTED NONE DETECTED   Amphetamines, Ur Screen NONE DETECTED NONE DETECTED   MDMA (Ecstasy)Ur Screen NONE DETECTED NONE DETECTED   Cocaine Metabolite,Ur [place] NONE DETECTED NONE DETECTED   Opiate, Ur Screen NONE DETECTED NONE DETECTED   Phencyclidine (PCP) Ur S NONE DETECTED NONE DETECTED   Cannabinoid 50 Ng, Ur [place] POSITIVE (A) NONE DETECTED   Barbiturates, Ur Screen NONE DETECTED NONE DETECTED   Benzodiazepine, Ur Scrn NONE DETECTED NONE DETECTED   Methadone Scn, Ur NONE DETECTED NONE DETECTED  Magnesium  Result Value Ref Range   Magnesium 2.1 1.7 - 2.4 mg/dL  Phosphorus  Result Value Ref Range   Phosphorus 4.1 2.5 - 4.6 mg/dL  Protime-INR  Result Value Ref Range   Prothrombin Time 12.7 11.4 - 15.2 seconds   INR 0.96   CK  Result Value Ref Range   Total CK >50,000 (H) 49 - 397 U/L  CK  Result Value Ref Range   Total CK 42,046 (H) 49 - 397 U/L  Blood gas, arterial  Result Value Ref Range   FIO2 0.21    pH, Arterial 7.50 (H) 7.350 - 7.450   pCO2 arterial 49 (H) 32.0 - 48.0 mmHg   pO2, Arterial 79 (L) 83.0 - 108.0 mmHg   Bicarbonate  38.2 (H) 20.0 - 28.0 mmol/L   Acid-Base Excess 13.0 (H) 0.0 - 2.0 mmol/L   O2 Saturation 96.6 %   Patient  temperature 37.0    Collection site RIGHT RADIAL    Sample type ARTERIAL DRAW    Allens test (pass/fail) PASS PASS  Comprehensive metabolic panel  Result Value Ref Range   Sodium 140 135 - 145 mmol/L   Potassium 3.6 3.5 - 5.1 mmol/L   Chloride 100 98 - 111 mmol/L   CO2 28 22 - 32 mmol/L   Glucose, Bld 105 (H) 70 - 99 mg/dL   BUN 9 6 - 20 mg/dL   Creatinine, Ser 4.01 0.61 - 1.24 mg/dL   Calcium 9.8 8.9 - 02.7 mg/dL   Total Protein 7.2 6.5 - 8.1 g/dL   Albumin 4.2 3.5 - 5.0 g/dL   AST 253 (H) 15 - 41 U/L   ALT 244 (H) 0 - 44 U/L   Alkaline Phosphatase 39 38 - 126 U/L   Total Bilirubin 0.6 0.3 - 1.2 mg/dL   GFR calc non Af Amer >60 >60 mL/min   GFR calc Af Amer >60 >60 mL/min   Anion gap 12 5 - 15  Hepatitis panel, acute  Result Value Ref Range   Hepatitis B Surface Ag Negative Negative   HCV Ab <0.1 0.0 - 0.9 s/co ratio   Hep A IgM Negative Negative   Hep B C IgM Negative Negative  CK  Result Value Ref Range   Total CK 33,624 (H) 49 - 397 U/L  CK  Result Value Ref Range   Total CK 30,927 (H) 49 - 397 U/L  Comprehensive metabolic panel  Result Value Ref Range   Sodium 138 135 - 145 mmol/L   Potassium 4.1 3.5 - 5.1 mmol/L   Chloride 108 98 - 111 mmol/L   CO2 24 22 - 32 mmol/L   Glucose, Bld 85 70 - 99 mg/dL   BUN 12 6 - 20 mg/dL   Creatinine, Ser 6.64 0.61 - 1.24 mg/dL   Calcium 9.9 8.9 - 40.3 mg/dL   Total Protein 7.3 6.5 - 8.1 g/dL   Albumin 4.0 3.5 - 5.0 g/dL   AST 474 (H) 15 - 41 U/L   ALT 219 (H) 0 - 44 U/L   Alkaline Phosphatase 37 (L) 38 - 126 U/L   Total Bilirubin 0.9 0.3 - 1.2 mg/dL   GFR calc non Af Amer >60 >60 mL/min   GFR calc Af Amer >60 >60 mL/min   Anion gap 6 5 - 15  CK  Result Value Ref Range   Total CK 21,412 (H) 49 - 397 U/L  CK  Result Value Ref Range   Total CK 15,425 (H) 49 - 397 U/L  Basic metabolic panel  Result Value Ref Range   Sodium 137 135 - 145 mmol/L   Potassium 3.7 3.5 - 5.1 mmol/L   Chloride 108 98 - 111 mmol/L   CO2 26 22  - 32 mmol/L   Glucose, Bld 87 70 - 99 mg/dL   BUN 10 6 - 20 mg/dL   Creatinine, Ser 2.59 0.61 - 1.24 mg/dL   Calcium 9.5 8.9 - 56.3 mg/dL   GFR calc non Af Amer >60 >60 mL/min   GFR calc Af Amer >60 >60 mL/min   Anion gap 3 (L) 5 - 15  CK  Result Value Ref Range   Total CK 10,232 (H) 49 - 397 U/L  CK  Result Value Ref Range  Total CK 7,441 (H) 49 - 397 U/L  Basic metabolic panel  Result Value Ref Range   Sodium 139 135 - 145 mmol/L   Potassium 3.5 3.5 - 5.1 mmol/L   Chloride 108 98 - 111 mmol/L   CO2 26 22 - 32 mmol/L   Glucose, Bld 90 70 - 99 mg/dL   BUN 10 6 - 20 mg/dL   Creatinine, Ser 7.82 0.61 - 1.24 mg/dL   Calcium 9.6 8.9 - 95.6 mg/dL   GFR calc non Af Amer >60 >60 mL/min   GFR calc Af Amer >60 >60 mL/min   Anion gap 5 5 - 15  CK  Result Value Ref Range   Total CK 6,440 (H) 49 - 397 U/L  CK  Result Value Ref Range   Total CK 5,296 (H) 49 - 397 U/L  Basic metabolic panel  Result Value Ref Range   Sodium 139 135 - 145 mmol/L   Potassium 3.6 3.5 - 5.1 mmol/L   Chloride 108 98 - 111 mmol/L   CO2 25 22 - 32 mmol/L   Glucose, Bld 90 70 - 99 mg/dL   BUN 9 6 - 20 mg/dL   Creatinine, Ser 2.13 0.61 - 1.24 mg/dL   Calcium 9.8 8.9 - 08.6 mg/dL   GFR calc non Af Amer >60 >60 mL/min   GFR calc Af Amer >60 >60 mL/min   Anion gap 6 5 - 15  CK  Result Value Ref Range   Total CK 3,826 (H) 49 - 397 U/L   Vision R 20/25 L 20/25 B 20/25 Hearing passed.     Assessment & Plan:   Problem List Items Addressed This Visit    None    Visit Diagnoses    Routine sports physical exam    -  Primary      Cleared to participate in football now at school and additional sports within 1 year.  Completed form, signed, stamped, returned original to patient and we scanned copy.  Encouraged healthy balanced diet Emphasis on doing well in school, staying active Reviewed routine preventative topics  Return for vaccines - patient did not have time today. - Flu vaccine regular dose  - HPV Vaccine dose #1 of 3 series, will return for nurse vaccines.   No orders of the defined types were placed in this encounter.   Follow up plan: Return in about 1 year (around 10/19/2020) for Annual Physical sports physical.  Saralyn Pilar, DO [facility] 10/20/2019, 2:52 PM

## 2019-10-20 NOTE — Patient Instructions (Addendum)
Thank you for coming to the office today.  Cleared.  Return for   Please schedule and return for a NURSE ONLY VISIT for VACCINE - anytime when ready in next 4 weeks - Need Regular Dose Flu Vaccine - Need 1st dose of Gardisil HPV vaccine - next dose in 2 months and then final dose in 6 months from now (April 2021)  Please schedule a Follow-up Appointment to: Return in about 1 year (around 10/19/2020) for Annual Physical sports physical.  If you have any other questions or concerns, please feel free to call the office or send a message through [facility]. You may also schedule an earlier appointment if necessary.  Additionally, you may be receiving a survey about your experience at our office within a few days to 1 week by e-mail or mail. We value your feedback.  Nobie Putnam, DO [facility]

## 2019-10-26 ENCOUNTER — Other Ambulatory Visit: Payer: Self-pay

## 2019-10-26 DIAGNOSIS — Z20822 Contact with and (suspected) exposure to covid-19: Secondary | ICD-10-CM

## 2019-10-28 LAB — NOVEL CORONAVIRUS, NAA: SARS-CoV-2, NAA: NOT DETECTED

## 2020-07-20 ENCOUNTER — Emergency Department (HOSPITAL_COMMUNITY)
Admission: EM | Admit: 2020-07-20 | Discharge: 2020-07-20 | Disposition: A | Discharge status: Home / Self Care | Payer: Medicaid Other | Attending: Emergency Medicine | Admitting: Emergency Medicine

## 2020-07-20 ENCOUNTER — Other Ambulatory Visit: Payer: Self-pay

## 2020-07-20 DIAGNOSIS — S01511A Laceration without foreign body of lip, initial encounter: Secondary | ICD-10-CM | POA: Diagnosis not present

## 2020-07-20 DIAGNOSIS — Y929 Unspecified place or not applicable: Secondary | ICD-10-CM | POA: Insufficient documentation

## 2020-07-20 DIAGNOSIS — Y9389 Activity, other specified: Secondary | ICD-10-CM | POA: Insufficient documentation

## 2020-07-20 DIAGNOSIS — T07XXXA Unspecified multiple injuries, initial encounter: Secondary | ICD-10-CM

## 2020-07-20 DIAGNOSIS — Y990 Civilian activity done for income or pay: Secondary | ICD-10-CM | POA: Diagnosis not present

## 2020-07-20 DIAGNOSIS — S060X0A Concussion without loss of consciousness, initial encounter: Secondary | ICD-10-CM

## 2020-07-20 DIAGNOSIS — T148XXA Other injury of unspecified body region, initial encounter: Secondary | ICD-10-CM

## 2020-07-20 MED ORDER — NAPROXEN 375 MG PO TABS: 375.0000 mg | ORAL_TABLET | Freq: Two times a day (BID) | ORAL | 0 refills | Status: AC

## 2020-07-20 MED ORDER — ONDANSETRON HCL 4 MG PO TABS
4.0000 mg | ORAL_TABLET | Freq: Once | ORAL | Status: AC
  Administered 2020-07-20: 4 mg via ORAL
  Filled 2020-07-20: qty 1

## 2020-07-20 MED ORDER — OXYCODONE HCL 5 MG PO TABS
5.0000 mg | ORAL_TABLET | Freq: Once | ORAL | Status: AC
  Administered 2020-07-20: 5 mg via ORAL
  Filled 2020-07-20: qty 1

## 2020-07-20 MED ORDER — MECLIZINE HCL 25 MG PO TABS: 25.0000 mg | ORAL_TABLET | Freq: Three times a day (TID) | ORAL | 0 refills | Status: AC | PRN

## 2020-07-20 NOTE — Discharge Instructions (Addendum)
WOUND CARE .  Keep area clean and dry for 24 hours. Do not remove bandage, if applied.  After 24 hours, remove bandage and wash wound gently with mild soap and warm water. Reapply a new bandage after cleaning wound, if directed.  Continue daily cleansing with soap and water until stitches/staples are removed.  Do not apply any ointments or creams to the wound while stitches/staples are in place, as this may cause delayed healing.  Seek medical careif you experience any of the following signs of infection: Swelling, redness, pus drainage, streaking, fever >101.0 F  Seek care if you experience excessive bleeding that does not stop after 15-20 minutes of constant, firm pressure.  Swish with warm salt water 2-3 times a day for your mouth.  Get help right away if: You have severe or worsening headaches. You have weakness or numbness in any part of your body. You are confused. Your coordination gets worse. You vomit repeatedly. You are sleepier than normal. Your speech is slurred. You cannot recognize people or places. You have a seizure. It is difficult to wake you up. You have unusual behavior changes. You have changes in your vision. You lose consciousness
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
## 2020-07-21 ENCOUNTER — Telehealth: Payer: Self-pay | Admitting: *Deleted

## 2020-07-21 NOTE — Telephone Encounter (Signed)
Called to patient to schedule NP appointment ,no answer left message to call back, [phone].                                         Jason Glass                                Patient Engagement Center

## 2020-07-21 NOTE — Telephone Encounter (Signed)
Contacted pt to comp   Transition Care Management Follow-up Telephone Call  . Medicaid Managed Care Transition Call Status:MM [facility] Call Made  . Date of discharge and from where: [facility]; 07/20/20 . How have you been since you were released from the hospital? ok  . Any questions or concerns? ok Items Reviewed: Marland Kitchen Did the pt receive and understand the discharge instructions provided? Yes  . Medications obtained and verified? Yes  . Any new allergies since your discharge? No  . Dietary orders reviewed? yes  Do you have support at home? Yes, family Functional Questionnaire: (I = Independent and D = Dependent)  ADLs: Independent Bathing/Dressing:Independent Meal Prep: Independent Eating: Independent Maintaining continence: Independent Transferring/Ambulation: Independent Managing Meds: Independent Follow up appointments reviewed:  PCP Hospital f/u appt confirmed?no pt states he needs a PCP because he no longer sees his old provider in [facility] f/u appt confirmed? n/a Are transportation arrangements needed? Yes   If their condition worsens, is the pt aware to call PCP or go to the EmergencyDept.? Yes  Was the patient provided with contact information for the PCP's office or ED? yes  Was to pt encouraged to call back with questions or concerns? yes  Notified Avie Arenas at [facility] will contact pt to schedule appt with new provider.

## 2020-10-18 IMAGING — US US HEPATIC LIVER DOPPLER
1 series · 14 of 25 positions shown · non-contrast
Comparison: None.

CLINICAL DATA: Hepatitis.

EXAM:
DUPLEX ULTRASOUND OF LIVER
TECHNIQUE: Color and duplex Doppler ultrasound was performed to evaluate the
hepatic in-flow and out-flow vessels.

[Series 1: us hepatic liver doppler · 0.22mm/px · 14 of 43 slices shown]
[im 1/43]
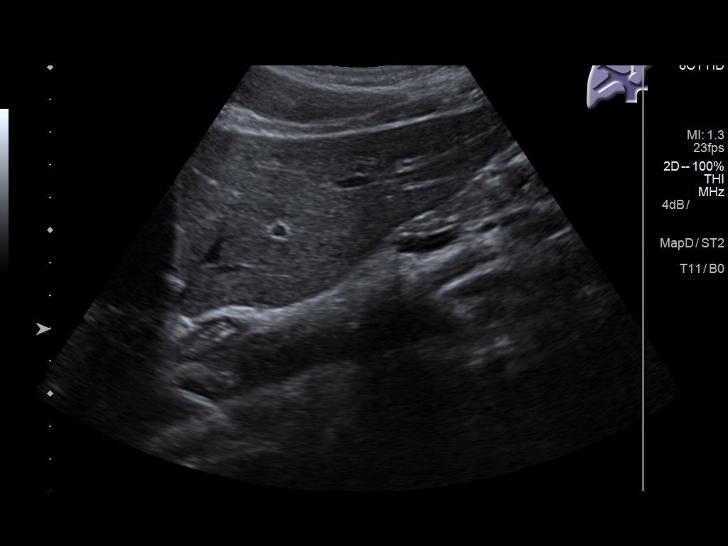
[im 4/43]
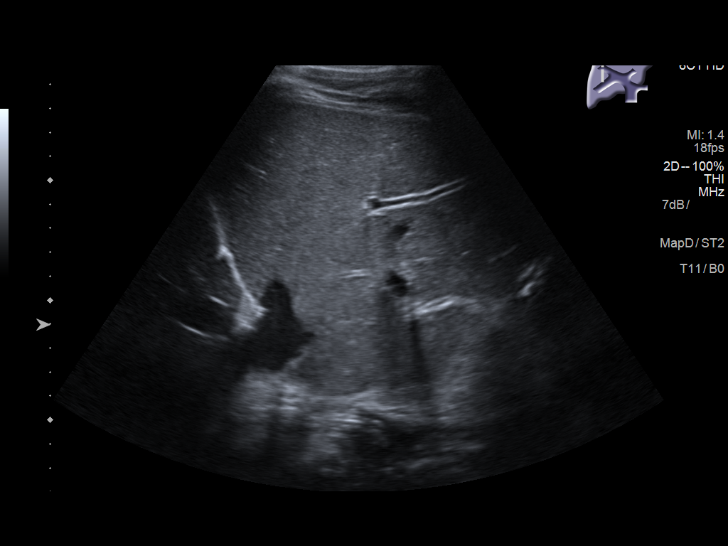
[im 8/43]
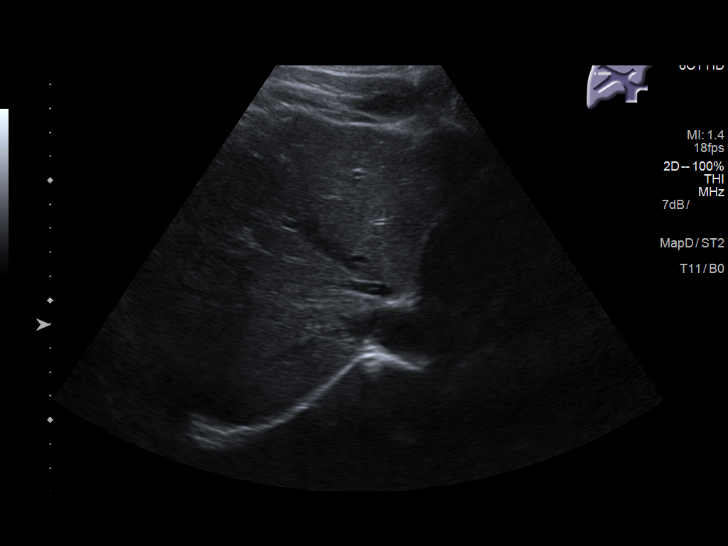
[im 11/43]
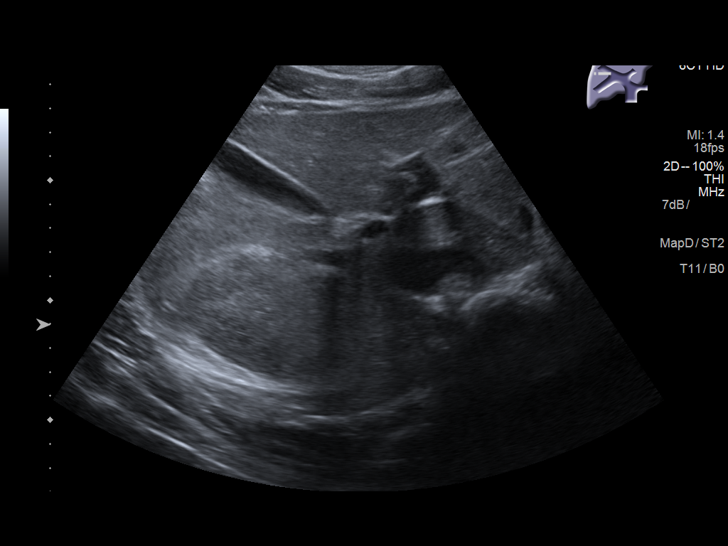
[im 15/43]
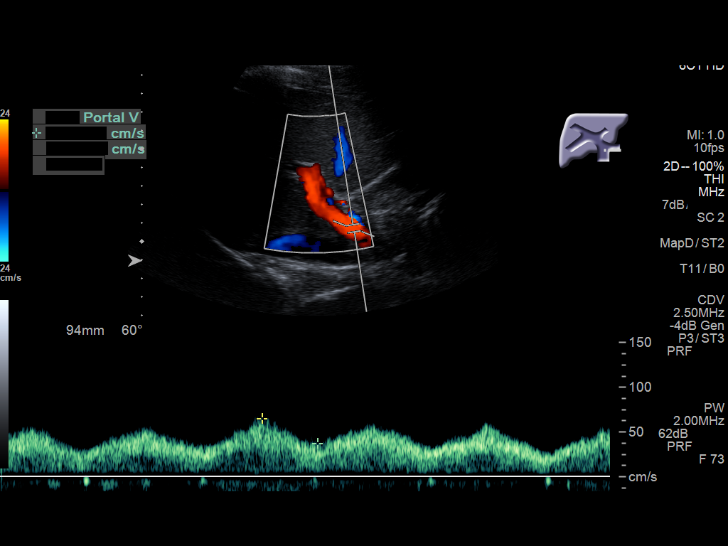
[im 16/43]
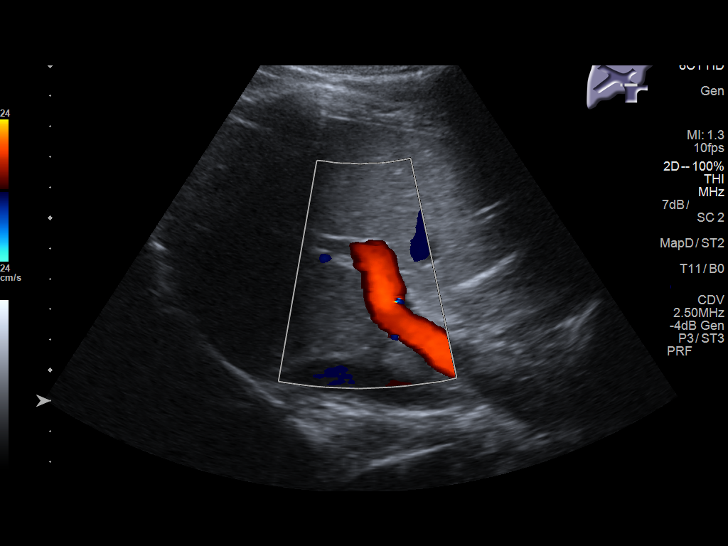
[im 20/43]
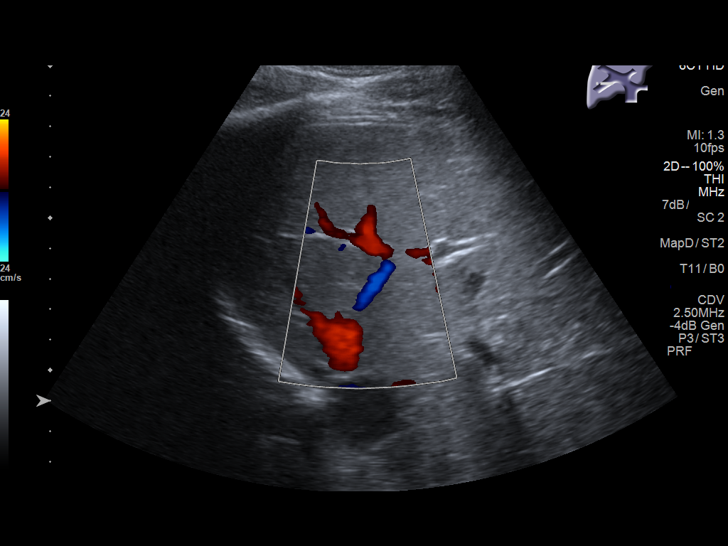
[im 23/43]
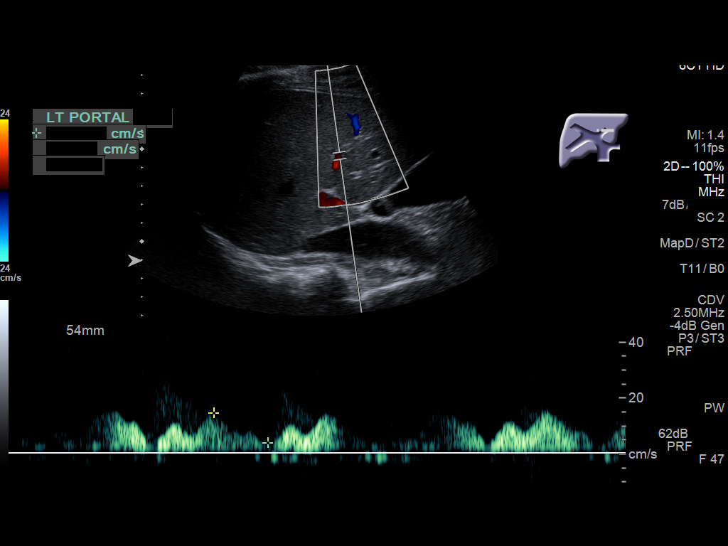
[im 27/43]
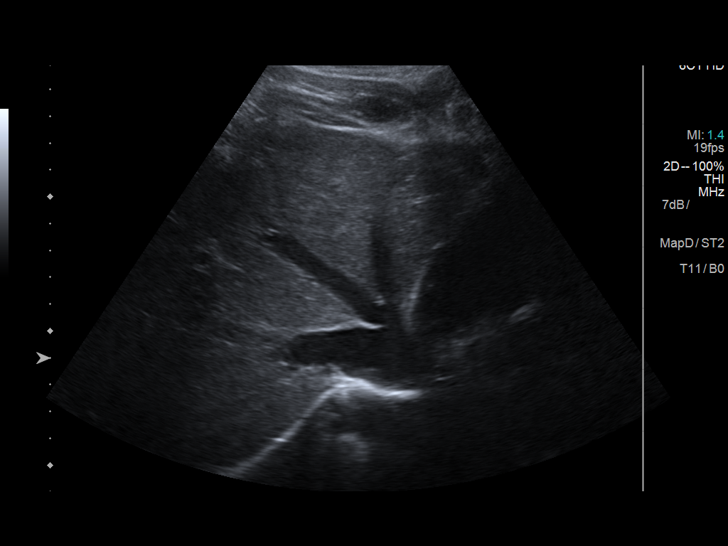
[im 29/43]
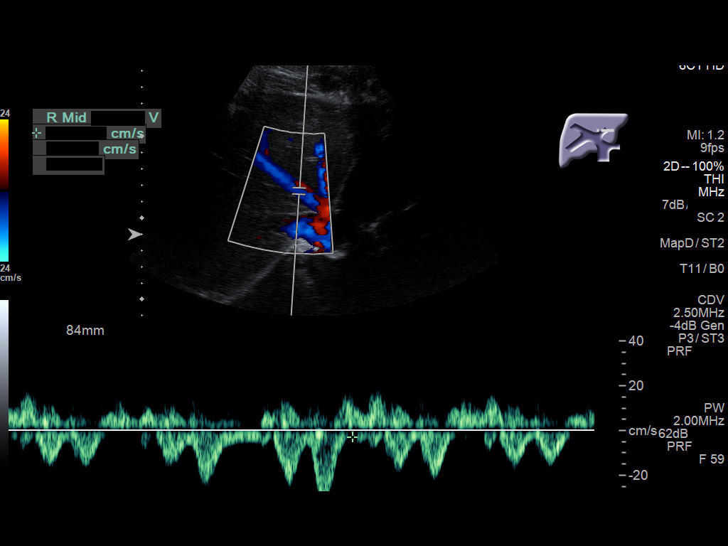
[im 32/43]
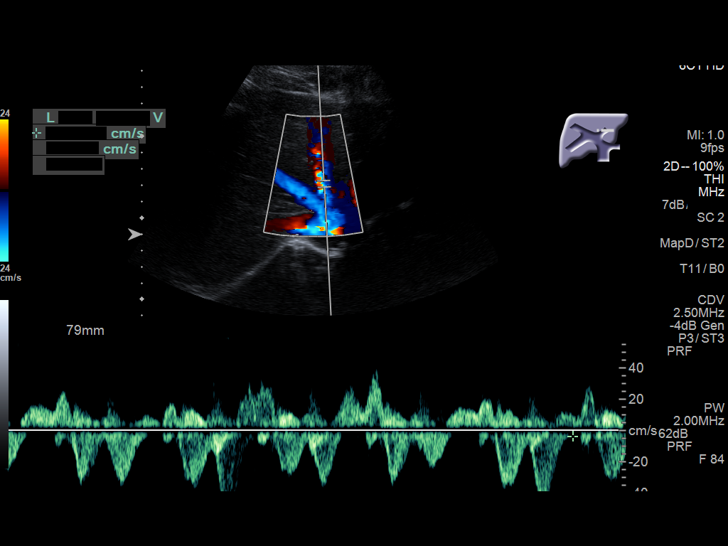
[im 36/43]
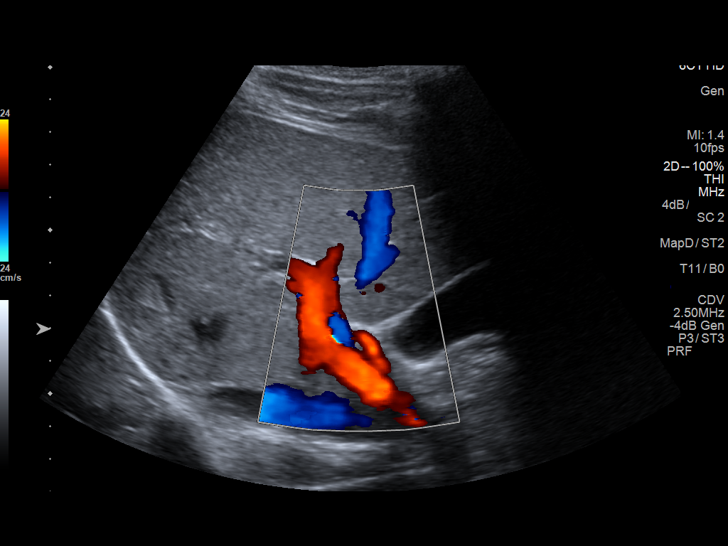
[im 39/43]
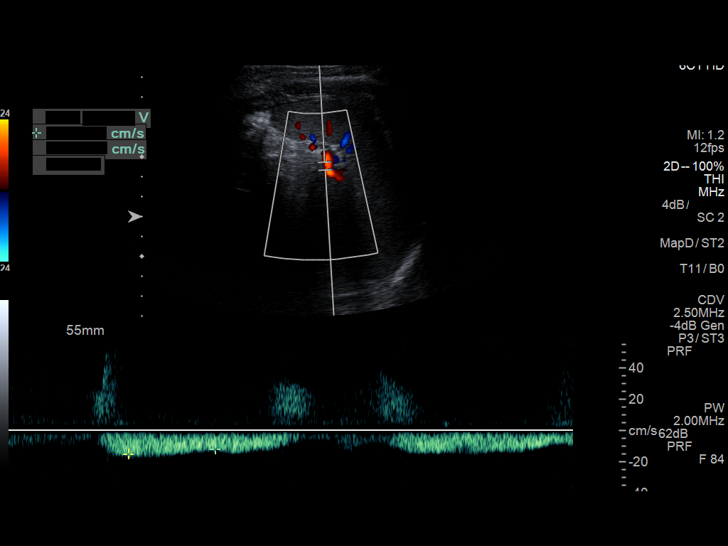
[im 43/43]
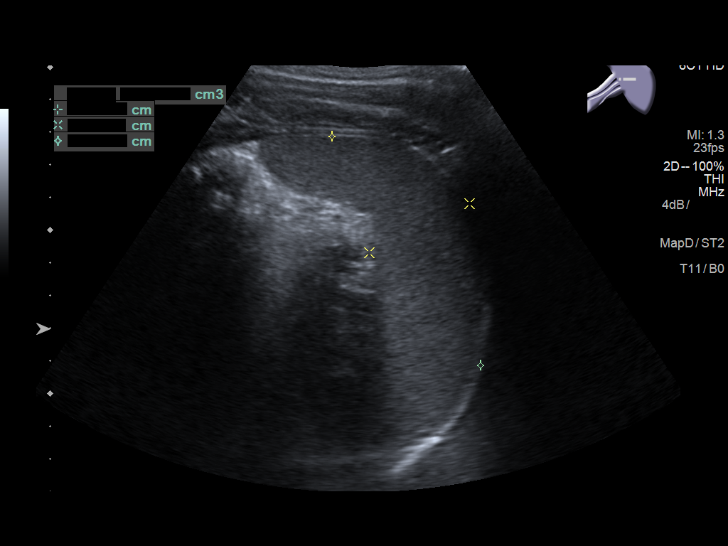

[14 of 25 positions shown; findings below may reference images not displayed]

FINDINGS: Liver: Normal parenchymal echogenicity. Normal hepatic contour
without nodularity.

No focal lesion, mass or intrahepatic biliary ductal dilatation.

Main Portal Vein size: 1.0 cm

Portal Vein Velocities

Main Prox:  65 cm/sec

Main Mid: 44 cm/sec

Main Dist:  51 cm/sec
Right: 13 cm/sec
Left: 15 cm/sec

Normal hepatopetal flow is noted in the portal veins.

Hepatic Vein Velocities

Right:  27 cm/sec

Middle:  37 cm/sec

Left:  52 cm/sec

Normal hepatofugal flow is noted in the hepatic veins.

IVC: Present and patent with normal respiratory phasicity.

Hepatic Artery Velocity:  93 cm/sec

Splenic Vein Velocity:  15 cm/sec

Spleen: 7.8 cm x 3.4 cm x 8.3 cm with a total volume of 117 cm^3
(411 cm^3 is upper limit normal)

Portal Vein Occlusion/Thrombus: No

Splenic Vein Occlusion/Thrombus: No

Ascites: None

Varices: None
IMPRESSION: No evidence of hepatic, portal or splenic venous thrombosis or
occlusion. Grossly normal spleen and liver.

## 2023-11-24 ENCOUNTER — Emergency Department (HOSPITAL_BASED_OUTPATIENT_CLINIC_OR_DEPARTMENT_OTHER)
Admission: EM | Admit: 2023-11-24 | Discharge: 2023-11-24 | Disposition: A | Discharge status: Home / Self Care | Payer: Medicaid Other | Attending: Emergency Medicine | Admitting: Emergency Medicine

## 2023-11-24 ENCOUNTER — Encounter (HOSPITAL_BASED_OUTPATIENT_CLINIC_OR_DEPARTMENT_OTHER): Payer: Self-pay | Admitting: Emergency Medicine

## 2023-11-24 DIAGNOSIS — J029 Acute pharyngitis, unspecified: Secondary | ICD-10-CM | POA: Insufficient documentation

## 2023-11-24 LAB — GROUP A STREP BY PCR: Group A Strep by PCR: NOT DETECTED

## 2023-11-24 NOTE — ED Provider Notes (Signed)
  [place] EMERGENCY DEPARTMENT AT [facility] Provider Note   CSN: [id number] Arrival date & time: 11/24/23  1744     History  Chief Complaint  Patient presents with   Sore Throat    Jason Glass is a 22 y.o. male.  22 year old male here today for sore throat.  Symptoms began 2 days ago.   Sore Throat       Home Medications Prior to Admission medications   Medication Sig Start Date End Date Taking? Authorizing Provider  acetaminophen (TYLENOL) 325 MG tablet Take 1 tablet (325 mg total) by mouth every 6 (six) hours as needed for mild pain or moderate pain (headache). 02/05/19   Salary, Evelena Asa, MD  meclizine (ANTIVERT) 25 MG tablet Take 1 tablet (25 mg total) by mouth 3 (three) times daily as needed for dizziness. 07/20/20   Arthor Captain, PA-C  naproxen (NAPROSYN) 375 MG tablet Take 1 tablet (375 mg total) by mouth 2 (two) times daily. 07/20/20   Arthor Captain, PA-C      Allergies    Patient has no known allergies.    Review of Systems   Review of Systems  Physical Exam Updated Vital Signs BP 116/72   Pulse 91   Temp 98.6 F (37 C)   Resp 18   Wt 63.5 kg   SpO2 94%   BMI 20.09 kg/m  Physical Exam Vitals reviewed.  HENT:     Nose: No congestion.     Mouth/Throat:     Mouth: Mucous membranes are moist.     Pharynx: Uvula midline. Oropharyngeal exudate present. No pharyngeal swelling or posterior oropharyngeal erythema.  Neurological:     Mental Status: He is alert.     ED Results / Procedures / Treatments   Labs (all labs ordered are listed, but only abnormal results are displayed) Labs Reviewed  GROUP A STREP BY PCR    EKG None  Radiology No results found.  Procedures Procedures    Medications Ordered in ED Medications - No data to display  ED Course/ Medical Decision Making/ A&P                                 Medical Decision Making 22 year old male here today with sore throat.  Differential diagnoses include viral  pharyngitis, strep pharyngitis.  Plan-patient does have some exudates on his bilateral tonsils.  Uvula midline.  Afebrile.  Centor score of 2.  Rapid strep was negative.  I evaluated the patient shortly after he was placed in a room.  When asked what brought him to the emergency department, the patient told me that he was here because he had strep throat.  I examined the patient, informed him that his strep swab was negative.  Was explained to the patient typical process of strep exam and PCR, and sometimes obtain cultures, patient stated that "if you all are not going to do anything for me, not something I can just get at the pharmacy, am going to leave.  After waiting here too long."  Patient then left the emergency department.  Left prior to receiving discharge paperwork.           Final Clinical Impression(s) / ED Diagnoses Final diagnoses:  Pharyngitis, unspecified etiology    Rx / DC Orders ED Discharge Orders     None         Arletha Pili, DO 11/24/23 1927

## 2023-11-24 NOTE — ED Triage Notes (Signed)
Pt c/o sore throat x 3 days. Reports feels like tonsils are swollen, endorses neck tenderness

## 2023-11-24 NOTE — Discharge Instructions (Addendum)
Your strep swab was negative.  You take Motrin and Tylenol at home.  Return to the emergency department if you develop increased swelling in your throat or difficulty swallowing.

## 2023-11-24 NOTE — ED Notes (Signed)
Pt left before discharge instructions were discussed discussed.

## 2023-11-25 ENCOUNTER — Encounter (HOSPITAL_COMMUNITY): Payer: Self-pay

## 2023-11-25 ENCOUNTER — Emergency Department (HOSPITAL_COMMUNITY)
Admission: EM | Admit: 2023-11-25 | Discharge: 2023-11-25 | Disposition: A | Discharge status: Home / Self Care | Payer: Medicaid Other | Attending: Emergency Medicine | Admitting: Emergency Medicine

## 2023-11-25 ENCOUNTER — Other Ambulatory Visit: Payer: Self-pay

## 2023-11-25 DIAGNOSIS — J029 Acute pharyngitis, unspecified: Secondary | ICD-10-CM | POA: Diagnosis not present

## 2023-11-25 DIAGNOSIS — Z20822 Contact with and (suspected) exposure to covid-19: Secondary | ICD-10-CM | POA: Diagnosis not present

## 2023-11-25 DIAGNOSIS — R5383 Other fatigue: Secondary | ICD-10-CM | POA: Diagnosis present

## 2023-11-25 LAB — RESP PANEL BY RT-PCR (RSV, FLU A&B, COVID)  RVPGX2
Influenza A by PCR: NEGATIVE
Influenza B by PCR: NEGATIVE
Resp Syncytial Virus by PCR: NEGATIVE
SARS Coronavirus 2 by RT PCR: NEGATIVE

## 2023-11-25 MED ORDER — IBUPROFEN 200 MG PO TABS
600.0000 mg | ORAL_TABLET | Freq: Once | ORAL | Status: AC
  Administered 2023-11-25: 600 mg via ORAL
  Filled 2023-11-25: qty 3

## 2023-11-25 MED ORDER — DEXAMETHASONE 4 MG PO TABS
10.0000 mg | ORAL_TABLET | Freq: Once | ORAL | Status: AC
  Administered 2023-11-25: 10 mg via ORAL
  Filled 2023-11-25: qty 1

## 2023-11-25 NOTE — ED Triage Notes (Signed)
Pt. Arrives POV alone stating that his glands are swollen and painful in his neck. Pt. Was seen yesterday for the same and for a sore throat. Pt. States that his throat still hurts pretty bad. States that his mom was also sick last week. Endorses a productive cough x5 days.

## 2023-11-25 NOTE — ED Provider Notes (Signed)
[place] EMERGENCY DEPARTMENT AT [facility] Provider Note   CSN: [id number] Arrival date & time: 11/25/23  1858     History  Chief Complaint  Patient presents with   Swollen Glands    Jason Glass is a 22 y.o. male.  HPI Patient presents with about 5 days of sore throat.  Has had fatigue.  Had had some chills but that resolved.  Occasional cough.  Seen at another ER yesterday with negative strep testing.  States continued pain.  Pain with swallowing.    Home Medications Prior to Admission medications   Medication Sig Start Date End Date Taking? Authorizing Provider  acetaminophen (TYLENOL) 325 MG tablet Take 1 tablet (325 mg total) by mouth every 6 (six) hours as needed for mild pain or moderate pain (headache). 02/05/19   Salary, Evelena Asa, MD  meclizine (ANTIVERT) 25 MG tablet Take 1 tablet (25 mg total) by mouth 3 (three) times daily as needed for dizziness. 07/20/20   Arthor Captain, PA-C  naproxen (NAPROSYN) 375 MG tablet Take 1 tablet (375 mg total) by mouth 2 (two) times daily. 07/20/20   Arthor Captain, PA-C      Allergies    Patient has no known allergies.    Review of Systems   Review of Systems  Physical Exam Updated Vital Signs BP 138/78 (BP Location: Left Arm)   Pulse (!) 52   Temp 98.6 F (37 C) (Oral)   Resp 18   SpO2 98%  Physical Exam Vitals reviewed.  HENT:     Mouth/Throat:     Comments: Bilateral tonsils swollen with exudate.  No peritonsillar swelling. Cardiovascular:     Rate and Rhythm: Regular rhythm.  Pulmonary:     Effort: No respiratory distress.  Abdominal:     Tenderness: There is no abdominal tenderness.  Musculoskeletal:        General: No tenderness.     Cervical back: Neck supple.     ED Results / Procedures / Treatments   Labs (all labs ordered are listed, but only abnormal results are displayed) Labs Reviewed  RESP PANEL BY RT-PCR (RSV, FLU A&B, COVID)  [id number]    EKG None  Radiology No results  found.  Procedures Procedures    Medications Ordered in ED Medications  ibuprofen (ADVIL) tablet 600 mg (600 mg Oral Given 11/25/23 2048)  dexamethasone (DECADRON) tablet 10 mg (10 mg Oral Given 11/25/23 2048)    ED Course/ Medical Decision Making/ A&P                                 Medical Decision Making Risk OTC drugs.   Patient with pharyngitis.  Has had for around 5 days.  Negative strep yesterday.  Differential diagnosis does include peritonsillar abscess but does not appear clinically to have 1.  Likely viral.  Patient drove here but will treat symptoms.  Will give steroids.  Also will give ibuprofen.  Will give stronger pain medicines for home.  Do not think we need antibiotics at this time.  Offered IM Toradol but patient does not like needles and refused.  Doubt pneumonia.  Negative viral testing.  Discharged home with symptomatic treatment.         Final Clinical Impression(s) / ED Diagnoses Final diagnoses:  Pharyngitis, unspecified etiology    Rx / DC Orders ED Discharge Orders     None  Benjiman Core, MD 11/25/23 2320

## 2024-02-14 DIAGNOSIS — A09 Infectious gastroenteritis and colitis, unspecified: Secondary | ICD-10-CM | POA: Diagnosis not present

## 2024-02-24 DIAGNOSIS — R519 Headache, unspecified: Secondary | ICD-10-CM | POA: Diagnosis not present

## 2024-02-24 DIAGNOSIS — Z20822 Contact with and (suspected) exposure to covid-19: Secondary | ICD-10-CM | POA: Diagnosis not present

## 2024-02-24 DIAGNOSIS — K5900 Constipation, unspecified: Secondary | ICD-10-CM | POA: Diagnosis not present

## 2024-02-24 DIAGNOSIS — J102 Influenza due to other identified influenza virus with gastrointestinal manifestations: Secondary | ICD-10-CM | POA: Diagnosis not present

## 2024-05-04 DIAGNOSIS — R319 Hematuria, unspecified: Secondary | ICD-10-CM | POA: Diagnosis not present

## 2024-05-10 DIAGNOSIS — R310 Gross hematuria: Secondary | ICD-10-CM | POA: Diagnosis not present

## 2024-08-17 DIAGNOSIS — R319 Hematuria, unspecified: Secondary | ICD-10-CM | POA: Diagnosis not present

## 2024-08-19 DIAGNOSIS — R310 Gross hematuria: Secondary | ICD-10-CM | POA: Diagnosis not present

## 2024-08-19 DIAGNOSIS — R300 Dysuria: Secondary | ICD-10-CM | POA: Diagnosis not present

## 2024-08-19 DIAGNOSIS — R3915 Urgency of urination: Secondary | ICD-10-CM | POA: Diagnosis not present

## 2024-08-27 DIAGNOSIS — A5619 Other chlamydial genitourinary infection: Secondary | ICD-10-CM | POA: Diagnosis not present

## 2024-08-27 DIAGNOSIS — A64 Unspecified sexually transmitted disease: Secondary | ICD-10-CM | POA: Diagnosis not present
